# Patient Record
Sex: Male | Born: 1985 | Race: Black or African American | Hispanic: No | Smoking: Current every day smoker
Health system: Southern US, Community
[De-identification: ages and names within clinical notes are randomized; demographics above are authoritative.]

## PROBLEM LIST (undated history)

## (undated) DIAGNOSIS — G4730 Sleep apnea, unspecified: Secondary | ICD-10-CM

## (undated) DIAGNOSIS — D75A Glucose-6-phosphate dehydrogenase (G6PD) deficiency without anemia: Secondary | ICD-10-CM

## (undated) HISTORY — PX: TONSILLECTOMY: SUR1361

---

## 2005-10-04 ENCOUNTER — Ambulatory Visit (HOSPITAL_COMMUNITY): Admission: RE | Admit: 2005-10-04 | Discharge: 2005-10-04 | Payer: Self-pay | Admitting: Family Medicine

## 2005-10-04 ENCOUNTER — Emergency Department (HOSPITAL_COMMUNITY): Admission: EM | Admit: 2005-10-04 | Discharge: 2005-10-04 | Payer: Self-pay | Admitting: Family Medicine

## 2005-12-17 ENCOUNTER — Emergency Department (HOSPITAL_COMMUNITY): Admission: EM | Admit: 2005-12-17 | Discharge: 2005-12-17 | Payer: Self-pay | Admitting: Family Medicine

## 2006-03-14 ENCOUNTER — Emergency Department (HOSPITAL_COMMUNITY): Admission: EM | Admit: 2006-03-14 | Discharge: 2006-03-14 | Payer: Self-pay | Admitting: Emergency Medicine

## 2007-02-17 ENCOUNTER — Emergency Department (HOSPITAL_COMMUNITY): Admission: EM | Admit: 2007-02-17 | Discharge: 2007-02-17 | Payer: Self-pay | Admitting: Emergency Medicine

## 2009-07-29 ENCOUNTER — Emergency Department (HOSPITAL_COMMUNITY): Admission: EM | Admit: 2009-07-29 | Discharge: 2009-07-29 | Payer: Self-pay | Admitting: Emergency Medicine

## 2009-08-17 ENCOUNTER — Emergency Department (HOSPITAL_COMMUNITY): Admission: EM | Admit: 2009-08-17 | Discharge: 2009-08-17 | Payer: Self-pay | Admitting: Family Medicine

## 2011-01-02 ENCOUNTER — Inpatient Hospital Stay (INDEPENDENT_AMBULATORY_CARE_PROVIDER_SITE_OTHER)
Admission: RE | Admit: 2011-01-02 | Discharge: 2011-01-02 | Disposition: A | Discharge status: Home / Self Care | Payer: Self-pay | Source: Ambulatory Visit | Attending: Family Medicine | Admitting: Family Medicine

## 2011-01-02 LAB — POCT I-STAT, CHEM 8
Chloride: 97 mEq/L (ref 96–112)
Creatinine, Ser: 0.7 mg/dL (ref 0.50–1.35)
Glucose, Bld: 379 mg/dL — ABNORMAL HIGH (ref 70–99)
Hemoglobin: 15.6 g/dL (ref 13.0–17.0)

## 2011-01-02 LAB — GLUCOSE, CAPILLARY: Glucose-Capillary: 368 mg/dL — ABNORMAL HIGH (ref 70–99)

## 2011-01-02 LAB — POCT URINALYSIS DIP (DEVICE)
Glucose, UA: 500 mg/dL — AB
Hgb urine dipstick: NEGATIVE
Nitrite: NEGATIVE
Protein, ur: NEGATIVE mg/dL
Specific Gravity, Urine: 1.015 (ref 1.005–1.030)

## 2011-01-12 ENCOUNTER — Inpatient Hospital Stay (HOSPITAL_COMMUNITY)
Admission: EM | Admit: 2011-01-12 | Discharge: 2011-01-14 | DRG: 638 | Disposition: A | Discharge status: Home / Self Care | Payer: Self-pay | Attending: Emergency Medicine | Admitting: Emergency Medicine

## 2011-01-12 DIAGNOSIS — E871 Hypo-osmolality and hyponatremia: Secondary | ICD-10-CM | POA: Diagnosis present

## 2011-01-12 DIAGNOSIS — G4733 Obstructive sleep apnea (adult) (pediatric): Secondary | ICD-10-CM | POA: Diagnosis present

## 2011-01-12 DIAGNOSIS — Z794 Long term (current) use of insulin: Secondary | ICD-10-CM

## 2011-01-12 DIAGNOSIS — E1310 Other specified diabetes mellitus with ketoacidosis without coma: Principal | ICD-10-CM | POA: Diagnosis present

## 2011-01-12 LAB — CBC
HCT: 38.7 % — ABNORMAL LOW (ref 39.0–52.0)
Hemoglobin: 13.6 g/dL (ref 13.0–17.0)
MCH: 30.8 pg (ref 26.0–34.0)
Platelets: 257 10*3/uL (ref 150–400)
RBC: 4.42 MIL/uL (ref 4.22–5.81)
WBC: 5.6 10*3/uL (ref 4.0–10.5)

## 2011-01-12 LAB — COMPREHENSIVE METABOLIC PANEL
ALT: 25 U/L (ref 0–53)
Alkaline Phosphatase: 84 U/L (ref 39–117)
BUN: 11 mg/dL (ref 6–23)
Calcium: 8.9 mg/dL (ref 8.4–10.5)
GFR calc Af Amer: >60 mL/min (ref >60)
Total Bilirubin: 0.3 mg/dL (ref 0.3–1.2)
Total Protein: 6.7 g/dL (ref 6.0–8.3)

## 2011-01-12 LAB — GLUCOSE, CAPILLARY
Glucose-Capillary: 417 mg/dL — ABNORMAL HIGH (ref 70–99)
Glucose-Capillary: 386 mg/dL — ABNORMAL HIGH (ref 70–99)

## 2011-01-12 LAB — URINALYSIS, ROUTINE W REFLEX MICROSCOPIC
Nitrite: NEGATIVE
Urobilinogen, UA: 0.2 mg/dL (ref 0.0–1.0)
pH: 6.5 (ref 5.0–8.0)

## 2011-01-12 LAB — DIFFERENTIAL
Basophils Relative: 1 % (ref 0–1)
Eosinophils Absolute: 0.2 10*3/uL (ref 0.0–0.7)
Eosinophils Relative: 4 % (ref 0–5)
Lymphs Abs: 2.2 10*3/uL (ref 0.7–4.0)
Monocytes Absolute: 0.4 10*3/uL (ref 0.1–1.0)

## 2011-01-13 LAB — BASIC METABOLIC PANEL
BUN: 9 mg/dL (ref 6–23)
Calcium: 8.9 mg/dL (ref 8.4–10.5)
Creatinine, Ser: 0.59 mg/dL (ref 0.50–1.35)
GFR calc non Af Amer: >60 mL/min (ref >60)

## 2011-01-13 LAB — GLUCOSE, CAPILLARY
Glucose-Capillary: 173 mg/dL — ABNORMAL HIGH (ref 70–99)
Glucose-Capillary: 232 mg/dL — ABNORMAL HIGH (ref 70–99)

## 2011-01-13 LAB — HEMOGLOBIN A1C: Mean Plasma Glucose: 355 mg/dL — ABNORMAL HIGH (ref <117)

## 2011-01-14 LAB — GLUCOSE, CAPILLARY

## 2011-01-17 LAB — GLUTAMIC ACID DECARBOXYLASE AUTO ABS: Glutamic Acid Decarb Ab: <1 U/mL (ref <=1.0)

## 2011-01-23 NOTE — Discharge Summary (Signed)
  NAMEAHNAF, CAPONI             ACCOUNT NO.:  [id number]  MEDICAL RECORD NO.:  [id number]  LOCATION:  5035                         FACILITY:  [facility]  PHYSICIAN:  Thalia Turkington, DO         DATE OF BIRTH:  July 28, 1985  DATE OF ADMISSION:  01/12/2011 DATE OF DISCHARGE:  01/14/2011                              DISCHARGE SUMMARY   CHIEF COMPLAINT:  Uncontrolled glucoses.  HISTORY OF PRESENT ILLNESS:  The patient is a 25 year old male who was recently diagnosed with diabetes mellitus.  He was placed on metformin. The patient presented to the emergency department in diabetic ketoacidosis, this patient was admitted.  He was given IV insulin until he close this gap.  Since then he has been on Lantus, his glucose is, however, on high in the last day.  We will increase his Lantus to 35 units.  He will continue his metformin twice daily.  The patient has had diabetic teaching and I have discussed with him in detail the symptoms of hypoglycemia and what actions to take if he has hypoglycemic.  I have discussed with him an appropriate diabetic diet and consequences of chronic right sugars.  These things were also covered with a nutrition consult and the diabetic teaching consult as well.  Today, the patient is doing well.  He is appropriate for discharge.  DISCHARGE INSTRUCTIONS:  ACTIVITY:  As tolerated.  MEDICATIONS: 1. Metformin 500 mg 1 p.o. b.i.d. 2. Lantus 35 units subcu q.p.m.  The patient is to check his fingerstick blood sugars every morning before breakfast and every evening before dinner and any time he feels that his sugars may be low or high he is to take in a record of his blood sugars to his visit to [facility].  The patient is to follow up with [facility] within a month.  DISCHARGE DIAGNOSES:  Diabetic ketoacidosis, obstructive sleep apnea, morbid obesity, hyponatremia which was actually pseudohyponatremia due to the patient's severe hyperglycemia, diabetes mellitus  type 2.  I spent 32 minutes on this discharge.          ______________________________ Fran Lowes, DO    AS/MEDQ  D:  01/14/2011  T:  01/14/2011  Job:  [id number]  cc:   [facility]  Electronically Signed by Fran Lowes DO on 01/23/2011 01:38:38 PM

## 2011-02-09 NOTE — H&P (Signed)
Robert Burton, Robert Burton NO.:  [id number]  MEDICAL RECORD NO.:  [id number]  LOCATION:  1858                         FACILITY:  [facility]  PHYSICIAN:  Zannie Cove, MD     DATE OF BIRTH:  1986/06/18  DATE OF ADMISSION:  01/12/2011 DATE OF DISCHARGE:                             HISTORY & PHYSICAL   PRIMARY CARE PHYSICIAN:  None.  CHIEF COMPLAINT:  Uncontrolled hyperglycemia, blood sugar is greater than 600.  HISTORY OF PRESENT ILLNESS:  Robert Burton is a 25 year old African American gentleman with obesity.  He was recently diagnosed with diabetes about a week ago at urgent care, presents to the hospital due to CBG reading greater than 600 despite taking NovoLog sliding scale that he was recently started on and also blurred vision and numbness in his legs.  The patient reports that since his diagnosis with diabetes little over a week ago, he was started on metformin and NovoLog sliding scale and his mother has been giving him his NovoLog sliding scale with meals.  In addition, he has been taking metformin 1000 mg b.i.d. Despite this, his sugars have been in the mid 400 range for this whole week and he was progressively feeling weak and having blurring of his vision which was worse in the last few days, and hence decided to come to the ER since his readings read very high greater than 600.  Upon evaluation in the ER, his bicarb was found to be normal and his sodium is mildly low at 128, this is likely pseudohyponatremia related to his hyperglycemia, and triad hospitalist were consulted for further management and evaluation.  PAST MEDICAL HISTORY: 1. Type 2 diabetes, newly diagnosed. 2. Suspected obstructive sleep apnea, sleep study pending.  MEDICATIONS: 1. Metformin 1000 mg b.i.d. 2. NovoLog sliding scale insulin.  ALLERGIES:  PENICILLIN and SULFA.  SOCIAL HISTORY:  Single.  Has a girlfriend and a child.  Lives at home with his mother.  Works as a Engineer, site at [employer].  Denies any history of smoking and drinks alcohol only on occasions.  Denies any illicit drug use.  FAMILY HISTORY:  Significant for diabetes in both parents.  REVIEW OF SYSTEMS:  Significant for increased blurring of vision and tingling and numbness in his hands and legs for over the past week.  PHYSICAL EXAMINATION:  VITAL SIGNS:  Temp is 98.4,  pulse is 84, blood pressure 122/75, respirations 16, and sating 97% on room air. GENERAL:  This is heavily fat African American gentleman, lying in the stretcher in no acute distress. HEENT: Pupils round and reactive to light.  Extraocular movements are intact.  Oral mucosa is dry. NECK:  No JVD or lymphadenopathy. CARDIOVASCULAR:  S1 and S2.  Regular rate and rhythm. LUNGS:  With mildly decreased breath sounds to the bases, otherwise clear. ABDOMEN:  Obese, soft, and nontender.  Positive bowel sounds.  No organomegaly. EXTREMITIES:  No edema, clubbing, or cyanosis. NEUROLOGIC:  Moves all extremities.  No localizing signs.  Pinprick and light touch was normal distally in his lower extremities.  LABORATORY DATA:  Review of laboratory data shows a white count of 5.6, hemoglobin 13.6, and platelets of 257.  Chemistries;  sodium 128, potassium 3.9, chloride 92, bicarb 27, BUN 11, creatinine 0.7, and glucose 500.  UA is clear with greater than 1000 glucose, negative proteins.  ASSESSMENT AND PLAN:  This is a 25 year old gentleman with uncontrolled hyperglycemia, newly diagnosed diabetes, and dehydration.  We will admit overnight for 23-hour observation.  We will discontinue his glucometer now that his blood sugars have improved and the glucometer to the 400 range.  I will start him on 20 units of Lantus and sliding scale as well as NovoLog meal coverage.  I will also continue aggressive IV fluid, hydration overnight.  The patient needs diabetic education, insulin teaching, and a primary care physician at the time of  discharge.  The patient will likely be discharged home tomorrow mid day once all of these have been set up.  Further management as condition evolves.     Zannie Cove, MD     PJ/MEDQ  D:  01/12/2011  T:  01/12/2011  Job:  [id number]  Electronically Signed by Zannie Cove  on 02/09/2011 04:18:50 PM

## 2011-09-25 ENCOUNTER — Encounter (HOSPITAL_COMMUNITY): Payer: Self-pay | Admitting: Emergency Medicine

## 2011-09-25 ENCOUNTER — Observation Stay (HOSPITAL_COMMUNITY)
Admission: EM | Admit: 2011-09-25 | Discharge: 2011-09-26 | Disposition: A | Discharge status: Home / Self Care | Payer: Self-pay | Attending: Emergency Medicine | Admitting: Emergency Medicine

## 2011-09-25 DIAGNOSIS — Z794 Long term (current) use of insulin: Secondary | ICD-10-CM | POA: Insufficient documentation

## 2011-09-25 DIAGNOSIS — R739 Hyperglycemia, unspecified: Secondary | ICD-10-CM

## 2011-09-25 DIAGNOSIS — G609 Hereditary and idiopathic neuropathy, unspecified: Secondary | ICD-10-CM | POA: Insufficient documentation

## 2011-09-25 DIAGNOSIS — G4730 Sleep apnea, unspecified: Secondary | ICD-10-CM | POA: Insufficient documentation

## 2011-09-25 DIAGNOSIS — Z91199 Patient's noncompliance with other medical treatment and regimen due to unspecified reason: Secondary | ICD-10-CM | POA: Insufficient documentation

## 2011-09-25 DIAGNOSIS — G629 Polyneuropathy, unspecified: Secondary | ICD-10-CM

## 2011-09-25 DIAGNOSIS — Z9119 Patient's noncompliance with other medical treatment and regimen: Secondary | ICD-10-CM | POA: Insufficient documentation

## 2011-09-25 DIAGNOSIS — E119 Type 2 diabetes mellitus without complications: Principal | ICD-10-CM | POA: Insufficient documentation

## 2011-09-25 HISTORY — DX: Sleep apnea, unspecified: G47.30

## 2011-09-25 LAB — GLUCOSE, CAPILLARY: Glucose-Capillary: 442 mg/dL — ABNORMAL HIGH (ref 70–99)

## 2011-09-25 NOTE — ED Notes (Signed)
Per EMS, pt from home, BS 399, pt reported to EMS that BS has been running high recently; pt also c/o numbness tingling in feet and hands X 1 week;

## 2011-09-25 NOTE — ED Notes (Signed)
Pts CBG 442

## 2011-09-25 NOTE — ED Notes (Signed)
Pt to ED for eval of high BS and numbness, pt reports for past week, bil hands get numb and bil feet get numb; pt reports that walking eases the numbness in feet;  Pt also reports BS have been running high for past week, pt reports he has not taking his insulin, Lantus,  in 4-5 days; and last time pt took metformin was 2 days ago; pt reports nausea; pt c/o nasal congestion and sore throat as well

## 2011-09-26 LAB — POCT I-STAT, CHEM 8
BUN: 12 mg/dL (ref 6–23)
Chloride: 100 mEq/L (ref 96–112)
HCT: 43 % (ref 39.0–52.0)
Potassium: 4.1 mEq/L (ref 3.5–5.1)

## 2011-09-26 LAB — CBC
HCT: 39.5 % (ref 39.0–52.0)
Platelets: 277 10*3/uL (ref 150–400)
RBC: 4.56 MIL/uL (ref 4.22–5.81)
RDW: 11.7 % (ref 11.5–15.5)
WBC: 7.4 10*3/uL (ref 4.0–10.5)

## 2011-09-26 LAB — GLUCOSE, CAPILLARY: Glucose-Capillary: 247 mg/dL — ABNORMAL HIGH (ref 70–99)

## 2011-09-26 MED ORDER — SODIUM CHLORIDE 0.9 % IV BOLUS (SEPSIS)
1000.0000 mL | Freq: Once | INTRAVENOUS | Status: AC
  Administered 2011-09-26: 1000 mL via INTRAVENOUS

## 2011-09-26 MED ORDER — HYDROCODONE-ACETAMINOPHEN 5-325 MG PO TABS: 1.0000 | ORAL_TABLET | Freq: Four times a day (QID) | ORAL | Status: AC | PRN

## 2011-09-26 MED ORDER — METFORMIN HCL 500 MG PO TABS: 500.0000 mg | ORAL_TABLET | Freq: Two times a day (BID) | ORAL | Status: DC

## 2011-09-26 MED ORDER — INSULIN GLARGINE 100 UNIT/ML ~~LOC~~ SOLN: 35.0000 [IU] | Freq: Every morning | SUBCUTANEOUS | Status: DC

## 2011-09-26 MED ORDER — INSULIN GLARGINE 100 UNIT/ML ~~LOC~~ SOLN
35.0000 [IU] | Freq: Every morning | SUBCUTANEOUS | Status: DC
  Administered 2011-09-26: 35 [IU] via SUBCUTANEOUS
  Filled 2011-09-26: qty 1

## 2011-09-26 MED ORDER — INSULIN ASPART 100 UNIT/ML ~~LOC~~ SOLN
10.0000 [IU] | Freq: Once | SUBCUTANEOUS | Status: AC
  Administered 2011-09-26: 10 [IU] via SUBCUTANEOUS
  Filled 2011-09-26: qty 1

## 2011-09-26 MED ORDER — ONDANSETRON HCL 4 MG/2ML IJ SOLN
4.0000 mg | Freq: Once | INTRAMUSCULAR | Status: AC
  Administered 2011-09-26: 4 mg via INTRAVENOUS
  Filled 2011-09-26: qty 2

## 2011-09-26 MED ORDER — MORPHINE SULFATE 4 MG/ML IJ SOLN
4.0000 mg | Freq: Once | INTRAMUSCULAR | Status: AC
  Administered 2011-09-26: 4 mg via INTRAVENOUS
  Filled 2011-09-26: qty 1

## 2011-09-26 MED ORDER — METFORMIN HCL 500 MG PO TABS
500.0000 mg | ORAL_TABLET | Freq: Two times a day (BID) | ORAL | Status: DC
  Administered 2011-09-26: 500 mg via ORAL
  Filled 2011-09-26 (×2): qty 1

## 2011-09-26 MED ORDER — SODIUM CHLORIDE 0.9 % IV SOLN
INTRAVENOUS | Status: DC
  Administered 2011-09-26: 03:00:00 via INTRAVENOUS

## 2011-09-26 MED ORDER — HYDROCODONE-ACETAMINOPHEN 5-325 MG PO TABS
2.0000 | ORAL_TABLET | Freq: Once | ORAL | Status: AC
  Administered 2011-09-26: 2 via ORAL
  Filled 2011-09-26: qty 2

## 2011-09-26 NOTE — ED Notes (Signed)
Case mgmt present to speak with pt

## 2011-09-26 NOTE — Progress Notes (Signed)
Met with patient and friend. Patient states he was turned away from [facility] back in July for not having appropriate documentation. I explained the role of the community liaison and the resources [facility] can provide. Patient was accepting of the referral. I also provided him with the physician resource list and explained Jovita Kussmaul. I provided patient with a community discount prescription card as well. Discussions in the room were frequently interjected upon by the friend and then I was handed the phone to speak with the patient's mother. After asking for permission to speak with the mother, I introduced myself to her. Her voice was extremely loud so I had to hold the phone away from my ear to continue the conversation. Mother seemed concerned about patient being discharged without receiving the appropriate therapy. Again, I introduced myself and explained my role and the resources I was providing the patient. I also let her know that his blood sugar was in a therapeutic range and his discharge was also being coordinated with outpatient resources. Mother increased her distress and stated that she did not like my attitude. I tried to subdue the situation and explain how we were helping her son. Mother then told me she would be arriving in 10 minutes "and take care of things". I informed both the attending nurse and the PA of my interactions. I made the referral to the community liaison and approved the prescription for lantus and tubed it to pharmacy.

## 2011-09-26 NOTE — ED Provider Notes (Signed)
History     CSN: [id number]  Arrival date & time 09/25/11  2308   First MD Initiated Contact with Patient 09/26/11 0004      Chief Complaint  Patient presents with  . Hyperglycemia    (Consider location/radiation/quality/duration/timing/severity/associated sxs/prior treatment) The history is provided by the patient.   Patient reports that he was diagnosed as being diabetic a few months ago. He was evaluated in emergency room and sent home with prescriptions for insulin and metformin. He has since ran out of those medications and states he has been taking his brothers insulin until a few days ago. Presents today complaining of bilateral feet and hand numbness. He has had urinary frequency and feels thirsty and dehydrated. [facility] initially was evaluated at [facility] and since told that he cannot followup there.  He has not attempted to find any no other primary care physician. No fevers or chills. Some nausea but no vomiting or diarrhea. He has been congested recently but denies any significant cold symptoms. He is mostly concerned about his medications and extremity tingling.  No rash. No trauma. No neck or back pain. Mild/moderate in severity. No known aggravating or alleviating factors. No history of same. Patient states he is unable to fill any prescriptions Past Medical History  Diagnosis Date  . Diabetes mellitus   . Sleep apnea     Past Surgical History  Procedure Date  . Tonsillectomy     History reviewed. No pertinent family history.  History  Substance Use Topics  . Smoking status: Never Smoker   . Smokeless tobacco: Not on file  . Alcohol Use: Yes     occasionally      Review of Systems  Constitutional: Negative for fever and chills.  HENT: Negative for neck pain and neck stiffness.   Eyes: Negative for pain.  Respiratory: Negative for shortness of breath.   Cardiovascular: Negative for chest pain.  Gastrointestinal: Negative for abdominal pain.  Genitourinary:  Negative for dysuria.  Musculoskeletal: Negative for back pain.  Skin: Negative for rash.  Neurological: Negative for dizziness, seizures, syncope, speech difficulty, weakness and headaches.  All other systems reviewed and are negative.    Allergies  Review of patient's allergies indicates no known allergies.  Home Medications   Current Outpatient Rx  Name Route Sig Dispense Refill  . INSULIN GLARGINE 100 UNIT/ML Lued[place] SOLN Subcutaneous Inject 35 Units into the skin every morning.    Marland Kitchen METFORMIN HCL 500 MG PO TABS Oral Take 500 mg by mouth 2 (two) times daily with a meal.      BP 143/76  Pulse 79  Temp(Src) 97.9 F (36.6 C) (Oral)  Resp 18  SpO2 97%  Physical Exam  Constitutional: He is oriented to person, place, and time. He appears well-developed and well-nourished.  HENT:  Head: Normocephalic and atraumatic.  Eyes: Conjunctivae and EOM are normal. Pupils are equal, round, and reactive to light.  Neck: Trachea normal. Neck supple. No thyromegaly present.  Cardiovascular: Normal rate, regular rhythm, S1 normal, S2 normal and normal pulses.     No systolic murmur is present   No diastolic murmur is present  Pulses:      Radial pulses are 2+ on the right side, and 2+ on the left side.  Pulmonary/Chest: Effort normal and breath sounds normal. He has no wheezes. He has no rhonchi. He has no rales. He exhibits no tenderness.  Abdominal: Soft. Normal appearance and bowel sounds are normal. There is no tenderness. There is  no CVA tenderness and negative Murphy's sign.  Musculoskeletal:       BLE:s Calves nontender, no cords or erythema, negative Homans sign  Neurological: He is alert and oriented to person, place, and time. He has normal strength. No cranial nerve deficit or sensory deficit. GCS eye subscore is 4. GCS verbal subscore is 5. GCS motor subscore is 6.       Subjective paresthesias to hands and feet with sensorium to light touch intact and equal throughout. No unilateral  deficits.  Skin: Skin is warm and dry. No rash noted. He is not diaphoretic.  Psychiatric: His speech is normal.       Cooperative and appropriate    ED Course  Procedures (including critical care time)  Labs Reviewed  GLUCOSE, CAPILLARY - Abnormal; Notable for the following:    Glucose-Capillary 442 (*)    All other components within normal limits  POCT I-STAT, CHEM 8 - Abnormal; Notable for the following:    Glucose, Bld 444 (*)    All other components within normal limits  GLUCOSE, CAPILLARY - Abnormal; Notable for the following:    Glucose-Capillary 247 (*)    All other components within normal limits  CBC   IV fluids. Subcutaneous insulin. Serial evaluations. Patient placed on hyperglycemia observation protocol.    MDM   Hyperglycemia with noncompliance and out of medications. Plan recheck in a.m. and social work consult for help with medications. Continue IV fluids and insulin as needed for blood sugars.         Sunnie Nielsen, MD 09/26/11 [phone]

## 2011-09-26 NOTE — ED Provider Notes (Signed)
8:26 AM Patient is in CDU for control of hyperglycemia, and consultation with case management for primary care follow up and medication refills.  Sign out received from Dr Dierdre Highman. Plan is for continued monitoring and case management consultation for free medications and help with PCP follow up, then discharge home.  Patient reports continued pain and numbness in bilateral hands and feet.  Discussed peripheral neuropathy as a result of uncontrolled diabetes, encouraged close control of blood sugars.  Offered literature which patient declined. On exam, pt is A&Ox4, NAD, RRR, CTAB.  Last cbg 234.  Will continue to follow.    8:58 AM I have spoken with Dewayne Hatch, Sports coach, who will speak with patient and Sunny Schlein, community liaison through [employer] for [employer].    9:18 AM Patient and significant other concerned about patient's continued numbness in his hands and feet.  Pt also notes that he is involved in court proceedings and has quit his job because it was too painful to stand on his feet all day - states the judge needs a letter from a doctor stating patient needs to be out of work for extended period of time.  I explained that this would be better fulfilled by a primary care provider.  Girlfriend also states she wants [place] to keep him and monitor him for his blurry vision and peripheral neuropathy - I have explained that we will be giving him the medications he needs to control his blood sugar and will be setting him up with doctors, which is what he needs as diabetes will be a life long issue and control of his blood sugar is ultimately what he needs.  I also encouraged him to have regular eye examinations and discussed the risks associated with uncontrolled diabetes.  Patient verbalizes understanding and agrees with plan.    10:35 AM Discussed patient and current situation with Dr Preston Fleeting who is aware of patient and agrees with plan.    10:41 AM Patient has been seen by Dewayne Hatch and Sunny Schlein.  They were able to  arrange for [facility] appointment for him today but patient does not have a photo ID.  They have given him phone numbers for follow up with them to schedule an appointment once he receives his ID.  (Pt states the ID should be in the mail, it is being replaced from his losing his wallet)  Apparently patient had previous appointment with [facility] after his hospitalization last summer but was turned away for not having an ID.  We are able to provide him with a vial of Lantus for free from the pharmacy.  And I have explained to him that (per Dewayne Hatch) he can pay $3.99 per year to get his metformin for free from [employer].    10:58 AM Discussed all results and plan with patient.  Patient verbalizes understanding and agrees with plan.  All questions answered.  Plan is for discharge.    Dillard Cannon [place], [place] 09/26/11 1552  Sunnie Nielsen, MD 09/28/11 [phone]

## 2011-09-26 NOTE — ED Notes (Signed)
Family member upset with current plan of care. States pt's pain not under control and that pt will not be able to fill prescription at home. Pt remains somnolent during this discussion. Family member also asked if pt can walk to cafeteria. PA aware.

## 2011-09-26 NOTE — ED Notes (Addendum)
Patient states his feet and hands feel numb and he feels nauseated. Patient states he was diagnosed with DM at end of 2012. Patient ran out of his medication and has not seen a doctor since he was hospitalized and diagnosed with DM. Patient states he has pain in his feet, bilateral at this time.

## 2011-09-26 NOTE — ED Notes (Signed)
Pt to be set up with [facility] at 1000 and also will receive lantus from [facility].

## 2011-09-26 NOTE — ED Notes (Signed)
Patient denies pain and is resting comfortably. States bilateral numbness in both hands.

## 2011-09-26 NOTE — Discharge Instructions (Signed)
Please read the information below.  Once your get your photo ID in the mail, please call Felicia back so she can help you set up an appointment to get your orange card and get an appointment with [facility].  You may also use the resources below to find a primary care provider.  Please use the Lantus as prescribed.  Follow a diabetic diet closely- this means you must decrease all sugars and starches from your meals (this includes not only sweets but white bread, white rice, potatoes, etc.)  Refer to the packet of information given to you upon discharge from the hospital this summer.  Remember that you should be able to get your metformin prescription filled at [facility] for approximately $4 for the entire year. Please take this medication twice daily as prescribed.  You may return to the ER at any time for worsening condition or any new symptoms that concern you.   Type 2 Diabetes Diabetes is a long-lasting (chronic) disease. One or both of the following happen with type 2 diabetes:   The pancreas does not make enough of a hormone called insulin.   The body has trouble using the insulin that is made.  HOME CARE  Check your blood sugar (glucose) once a day, or as told by your doctor.   Take all medicine as told by your doctor.   Do not smoke.   Eat healthy foods. Weight loss can help your diabetes.   Learn about low blood sugar (hypoglycemia). Know how to treat it.   Get your eyes checked on a regular basis.   Get a physical exam every year. Get your blood pressure checked. Get your blood and pee (urine) tested.   Wear a necklace or bracelet that says you have diabetes.   Check your feet every night for cuts, sores, blisters, and redness. Tell your doctor if you have problems.  GET HELP RIGHT AWAY IF:  You have trouble keeping your blood sugar in target range.   You have problems with your medicines.   You are sick and not getting better after 24 hours.   You have a sore or  wound that is not healing.   You have vision problems or changes.   You have a fever.  MAKE SURE YOU:  Understand these instructions.   Will watch your condition.   Will get help right away if you are not doing well or get worse.  Document Released: 04/04/2008 Document Revised: 06/15/2011 Document Reviewed: 12/12/2010 [facility] Patient Information 2012 [place], [place].Hyperglycemia Hyperglycemia occurs when the glucose (sugar) in your blood is too high. Hyperglycemia can happen for many reasons, but it most often happens to people who do not know they have diabetes or are not managing their diabetes properly.  Whether you have diabetes or not, there are other causes of hyperglycemia. Hyperglycemia can occur when you have diabetes, but it can also occur in other situations that you might not be as aware of, such as:  Diabetes.   Pre-diabetes. Before people develop Type 2 diabetes, they almost always have "pre-diabetes." This is when your blood glucose levels are higher than normal, but not yet high enough to be diagnosed as diabetes.   Stress. Stress can elevate your blood glucose.   Taking steroids. One side effect can be a rise in blood glucose.  SYMPTOMS  Signs of hyperglycemia include having extreme thirst, dry mouth, blurred vision, needing to urinate often, being overly tired, weak or sleepy, or having tingling in  the feet or leg. DIAGNOSIS  Diagnosis is made by monitoring blood glucose. TREATMENT  Knowing the cause of the hyperglycemia is important before the hyperglycemia can be treated. Treatment may include, but is not be limited to:  Education.   Change or adjustment in medications, your meal plan, exercise plan or lifestyle.   Treatment for an illness, infection, etc.   More frequent blood glucose monitoring.   Decreasing or stopping steroids.  HOME CARE INSTRUCTIONS   Test your blood glucose as directed.   Exercise regularly.   Eat wholesome, balanced meals.  Eat often and at regular, fixed times.   Being at an ideal weight is important. If needed, losing as little as 10 to 15 pounds may help improve blood glucose levels.  SEEK MEDICAL CARE IF:   You have questions about medicine, activity or diet.   You continue to have symptoms such as increased thirst, urination or weight gain.  SEEK IMMEDIATE MEDICAL CARE IF:   You are vomiting or have diarrhea.   Your breath smells fruity.   You are breathing faster or slower.   You are very sleepy or incoherent.   You have numbness, tingling, or pain in your feet or hands.   You have chest pain.   Your symptoms get worse even though you have been following your caregiver's orders.  Document Released: 06/26/2005 Document Revised: 06/15/2011 Document Reviewed: 02/16/2009 [facility] Patient Information 2012 [place], [place].Insulin Storage and Care All insulin pens and bottles (vials) have expiration dates. Refrigerated, unopened insulin pens and vials are good until the expiration date. Do not use insulin after this date. Once insulin is opened, it should be thrown away at the appropriate time. The table below will help guide you. INSULINS  Humalog (lispro)   Vials: 28 days   Pens: 28 days   NovoLog(aspart)   Vials: 28 days   Pens: 28 days   Apidra( glulisine)   Vials: 28 days*   Pens: 28 days*   Humulin R (regular)   Vials: 28 days   Pens: 28 days   Humulin R (regular) U-500   Vials: 28 days   Pens: NA   Novolin R (regular)   Vials: 42 days*   Pens: 28 days   Humulin N (NPH)   Vials: 28 days   Pens: 14 days   Novolin N (NPH)   Vials: 42 days*   Pens: 14 days   Humulin 70/30   Vials: 28 days   Pens: 10 days   Humulin 50/50   Vials: 28 days   Pens: 10 days   Novolin 70/30   Vials: 42 days*   Pens: 10 days   Humalog Mix 75/25   Vials: 28 days   Pens: 10 days   Humalog 50/50   Vials: 28 days   Pens: 10 days   NovoLog Mix  70/30   Vials: 28 days   Pens: 14 days   Lantus (glargine)   Vials: 28 days   Pens: 28 days   Levemir (detemir)   Vials: 42 days*   Pens: 42 days*  CARING FOR YOUR INSULIN  If insulin is kept at room temperature, it must be less than 86 F (30 C). Those marked * must be stored at less than 77 F (25 C).   If insulin is kept in the refrigerator, it must be between 36-46 F (2.22-7.77 C).   Do not freeze insulin.   Keep insulin away from direct heat or sunlight.   Throw away  the insulin if it is discolored, thick or has clumps, or suspended white particles.   Be sure to mix "cloudy" insulins by rolling gently. Pens can be "rocked" from end to end.   Opened insulin pens should be kept at room temperature.   Always have extra insulin on hand.  Every effort is made to assure accuracy. This information is meant to be used as a tool to guide you in the proper care and storage of insulin. Document Released: 04/23/2009 Document Revised: 06/15/2011 Document Reviewed: 04/23/2009 [facility] Patient Information 2012 [place], [place].Neuropathy Neuropathy means your peripheral nerves are not working normally. Peripheral nerves are the nerves outside the brain and spinal cord. Messages between the brain and the rest of the body do not work properly with peripheral nerve disorders. CAUSES There are many different causes of peripheral nerve disorders. These include:  Injury.   Infections.   Diabetes.   Vitamin deficiency.   Poor circulation.   Alcoholism.   Exposure to toxins.   Drug effects.   Tumors.   Kidney disease.  SYMPTOMS  Tingling, burning, pain, and numbness in the extremities.   Weakness and loss of muscle tone and size.  DIAGNOSIS Blood tests and special studies of nerve function may help confirm the diagnosis.  TREATMENT  Treatment includes adopting healthy life habits.   A good diet, vitamin supplements, and mild pain medicine may be needed.    Avoid known toxins such as alcohol, tobacco, and recreational drugs.   Anti-convulsant medicines are helpful in some types of neuropathy.  Make a follow-up appointment with your caregiver to be sure you are getting better with treatment.  SEEK IMMEDIATE MEDICAL CARE IF:   You have breathing problems.   You have severe or uncontrolled pain.   You notice extreme weakness or you feel faint.   You are not better after 1 week or if you have worse symptoms.  Document Released: 08/03/2004 Document Revised: 03/08/2011 Document Reviewed: 06/26/2005 [facility] Patient Information 2012 [place], [place].  If you have no primary doctor, here are some resources that may be helpful:  Medicaid-accepting [facility] Providers:   - [facility]- [address]      [phone]      Mon-Fri 9am-7pm, Sat 9am-1pm   - [facility]- [address] [place]      [phone]   - [facility]- [address], [address] [place]      [phone]   [facility]- [address]      [phone]   - Renaye Rakers- [address] [place], [address]      [phone]      Only accepts [place] patients       after they have her name applied to their card   Self Pay (no insurance) in [place]:   - Sickle Cell Patients: Dr Willey Blade, [facility]      [address] [place]      [phone]   - [phone]   - Physician Referral Service- [phone]   - [facility]- [address] [place]      [phone]   [facility] [place]- [address]   - [facility]- see information above      (Speak to [employer] if you do not have insurance)   - [phone]  [address]      [phone]   - [facility]- [address]      [phone]   - [facility]- [address]      [phone]   - Dr Julio Sicks-  [address], [place]      [phone]   - [facility]- [address]      [phone]   - [facility]- [address]      [phone]      Also [address]      [phone]   - [facility]- [address]      [phone]      1st and 3rd Saturday every month, 10am-1pm Other agencies that provide inexpensive medical care:    [facility]  [phone]    [facility]  [phone]    [facility]  [phone]    [facility]  [phone]    [facility]  [phone]  General Information: Finding a doctor when you do not have health insurance can be tricky. Although you are not limited by an insurance plan, you are of course limited by her finances and how much but he can pay out of pocket.  What are your options if you don't have health insurance?   1) Find a Librarian, academic and Pay Out of Pocket Although you won't have to find out who is covered by your insurance plan, it is a good idea to ask around and get recommendations. You will then need to call the office and see if the doctor you have chosen will accept you as a new patient and what types of options they offer for patients who are self-pay. Some doctors offer discounts or will set up payment plans for their patients who do not have insurance, but you will need to ask so you aren't surprised when you get to your appointment.  2) Contact Your Local Health Department Not all health departments have doctors that can see patients for sick visits, but many do, so it is worth a call to see if yours does. If you don't know where your local health department is, you can check in your phone book. The CDC also has a tool to help you locate your state's health department, and many state websites also have listings of all of their local health departments.  3) Find a Walk-in Clinic If your illness is not likely to be very severe or complicated, you may want to try a walk  in clinic. These are popping up all over the country in pharmacies, drugstores, and shopping centers. They're usually staffed by nurse practitioners or physician assistants that have been trained to treat common illnesses and complaints. They're usually fairly quick and inexpensive. However, if you have serious medical issues or chronic medical problems, these are probably not your best option   RESOURCE GUIDE  Dental Problems  Patients with Medicaid: [facility] [phone]                                           [phone] [employer] [address] Phone:  [phone]  Phone:  [phone]  If unable to pay or uninsured, contact:  [facility] or [facility]. to become qualified for the adult dental clinic.  Chronic Pain Problems Contact [facility]  [phone] Patients need to be referred by their primary care doctor.  Insufficient Money for Medicine Contact [employer]:  call "211" or [facility] [phone].  No Primary Care Doctor Call [facility]  [phone] Other agencies that provide inexpensive medical care    [facility]  [phone]    [facility]  [phone]    [facility]  [phone]    [facility]  [phone]    [facility]  [phone]    [facility]  [phone]  [facility]  [phone] [facility]  [phone] [facility]   [phone] (emergency services [phone])  Substance Abuse Resources [facility]  [phone] [facility] [phone] [place] [phone] Floydene Flock [phone] Residential & Outpatient Substance Abuse Program  [phone]  Abuse/Neglect [facility] Child Abuse Hotline 567-706-1980 [facility] Child Abuse Hotline (380)260-8463 (After Hours)  Emergency  [facility] [phone]  [facility] [place] [facility] [phone] [facility] [phone]  MRSA Hotline #:   347-853-1622    [facility] Resources  [facility]     [employer]                          [facility] Dept. [address] [place]                       [address]      [address]  Blondell Reveal Phone:  [phone]                                   Phone:  [phone]                 Phone:  [phone]  [facility] Phone:  [phone]  [facility] Child Abuse Hotline 613-313-8727 803-324-1361 (After Hours)

## 2011-09-26 NOTE — ED Notes (Addendum)
Pt d/c hospital in [place]. PT given Lantus and d/c instructions on medication administration and how to get medications filled. Pt voiced understanding of d/c instructions and that he would follow up here once he has photo id to get orange card and [facility] appt.

## 2011-09-26 NOTE — ED Notes (Signed)
Report given to Vickey Huger, Charity fundraiser. Patient being transported to CDU4.

## 2011-09-26 NOTE — ED Notes (Signed)
Diet tray ordered 

## 2011-09-26 NOTE — Progress Notes (Signed)
Received vial of lantus and transferred to the attending nurse, Alycia Rossetti. Felicia of [facility] met with patient and discovered she cannot be of any assistance because the patient does not have any form of identification and is not sure when he will receive any. All of this information was passed along to the PA.

## 2012-02-08 ENCOUNTER — Emergency Department (HOSPITAL_COMMUNITY)
Admission: EM | Admit: 2012-02-08 | Discharge: 2012-02-08 | Discharge status: Absent without leave | Payer: Self-pay | Source: Home / Self Care | Attending: Emergency Medicine | Admitting: Emergency Medicine

## 2013-01-28 ENCOUNTER — Encounter (HOSPITAL_COMMUNITY): Payer: Self-pay

## 2013-01-28 ENCOUNTER — Emergency Department (HOSPITAL_COMMUNITY)
Admission: EM | Admit: 2013-01-28 | Discharge: 2013-01-28 | Disposition: A | Discharge status: Home / Self Care | Payer: Self-pay | Attending: Emergency Medicine | Admitting: Emergency Medicine

## 2013-01-28 DIAGNOSIS — Z23 Encounter for immunization: Secondary | ICD-10-CM | POA: Insufficient documentation

## 2013-01-28 DIAGNOSIS — E119 Type 2 diabetes mellitus without complications: Secondary | ICD-10-CM | POA: Insufficient documentation

## 2013-01-28 DIAGNOSIS — Z794 Long term (current) use of insulin: Secondary | ICD-10-CM | POA: Insufficient documentation

## 2013-01-28 DIAGNOSIS — S51842A Puncture wound with foreign body of left forearm, initial encounter: Secondary | ICD-10-CM

## 2013-01-28 DIAGNOSIS — Y929 Unspecified place or not applicable: Secondary | ICD-10-CM | POA: Insufficient documentation

## 2013-01-28 DIAGNOSIS — W268XXA Contact with other sharp object(s), not elsewhere classified, initial encounter: Secondary | ICD-10-CM | POA: Insufficient documentation

## 2013-01-28 DIAGNOSIS — Y9389 Activity, other specified: Secondary | ICD-10-CM | POA: Insufficient documentation

## 2013-01-28 DIAGNOSIS — Z79899 Other long term (current) drug therapy: Secondary | ICD-10-CM | POA: Insufficient documentation

## 2013-01-28 DIAGNOSIS — S51809A Unspecified open wound of unspecified forearm, initial encounter: Secondary | ICD-10-CM | POA: Insufficient documentation

## 2013-01-28 MED ORDER — BACITRACIN 500 UNIT/GM EX OINT
1.0000 "application " | TOPICAL_OINTMENT | Freq: Two times a day (BID) | CUTANEOUS | Status: DC
  Administered 2013-01-28: 1 via TOPICAL
  Filled 2013-01-28: qty 28.4

## 2013-01-28 MED ORDER — TETANUS-DIPHTH-ACELL PERTUSSIS 5-2.5-18.5 LF-MCG/0.5 IM SUSP
0.5000 mL | Freq: Once | INTRAMUSCULAR | Status: AC
  Administered 2013-01-28: 0.5 mL via INTRAMUSCULAR
  Filled 2013-01-28 (×2): qty 0.5

## 2013-01-28 NOTE — ED Provider Notes (Signed)
History  This chart was scribed for Glade Nurse, PA-C working with Enid Skeens, MD by Greggory Stallion, ED scribe. This patient was seen in room TR08C/TR08C and the patient's care was started at 8:41 PM.  CSN: [id number] Arrival date & time 01/28/13  2011  Chief Complaint  Patient presents with  . Foreign Body   The history is provided by the patient. No language interpreter was used.    HPI Comments: Robert Burton is a 27 y.o. male who presents to the Emergency Department complaining of a fish hook under his left elbow that happened about an hour ago as he was trying to throw the line and it got caught in his skin. Pt states pain is minimal, intermittent, localized. No active bleeding. Pt states his last tetanus was 7 years ago. Pt denies fever, nausea, vomiting.   Past Medical History  Diagnosis Date  . Diabetes mellitus   . Sleep apnea    Past Surgical History  Procedure Laterality Date  . Tonsillectomy     History reviewed. No pertinent family history. History  Substance Use Topics  . Smoking status: Never Smoker   . Smokeless tobacco: Not on file  . Alcohol Use: Yes     Comment: occasionally    Review of Systems  Constitutional: Negative for fever and diaphoresis.  HENT: Negative for neck pain and neck stiffness.   Eyes: Negative for visual disturbance.  Respiratory: Negative for apnea, chest tightness and shortness of breath.   Cardiovascular: Negative for chest pain and palpitations.  Gastrointestinal: Negative for diarrhea and constipation.  Genitourinary: Negative for dysuria.  Musculoskeletal: Negative for gait problem.  Skin: Positive for wound. Negative for rash.       Left anterior forearm distal to the elbow  Neurological: Negative for dizziness, weakness, light-headedness and headaches.    Allergies  Other  Home Medications   Current Outpatient Rx  Name  Route  Sig  Dispense  Refill  . insulin glargine (LANTUS) 100 UNIT/ML injection    Subcutaneous   Inject 35 Units into the skin every morning.         . metFORMIN (GLUCOPHAGE) 1000 MG tablet   Oral   Take 1,000 mg by mouth 2 (two) times daily with a meal.         . EXPIRED: metFORMIN (GLUCOPHAGE) 500 MG tablet   Oral   Take 1 tablet (500 mg total) by mouth 2 (two) times daily with a meal.   60 tablet   1    BP 150/97  Pulse 79  Temp(Src) 98.5 F (36.9 C) (Oral)  Resp 16  SpO2 97%  Physical Exam  Nursing note and vitals reviewed. Constitutional: He is oriented to person, place, and time. He appears well-developed and well-nourished. No distress.  HENT:  Head: Normocephalic and atraumatic.  Eyes: Conjunctivae and EOM are normal.  Neck: Normal range of motion. Neck supple.  No meningeal signs  Cardiovascular: Normal rate, regular rhythm and normal heart sounds.  Exam reveals no gallop and no friction rub.   No murmur heard. Pulmonary/Chest: Effort normal and breath sounds normal. No respiratory distress. He has no wheezes. He has no rales. He exhibits no tenderness.  Abdominal: Soft. Bowel sounds are normal. He exhibits no distension. There is no tenderness. There is no rebound and no guarding.  Musculoskeletal: Normal range of motion. He exhibits no edema and no tenderness.  Neurological: He is alert and oriented to person, place, and time. No cranial nerve deficit.  Skin: Skin is warm and dry. He is not diaphoretic. No erythema.  Fish hook penetrating the left anterior forearm distal to the elbow. No active bleeding. Barbed side is immersed in the skin.   Psychiatric: He has a normal mood and affect.    ED Course  Procedures  DIAGNOSTIC STUDIES: Oxygen Saturation is 97% on RA, normal by my interpretation.    COORDINATION OF CARE: 8:45 PM-Discussed treatment plan which includes removing fish hook with pt at bedside and pt agreed to plan.   Labs Reviewed - No data to display No results found. 1. Penetrating foreign body of skin of left forearm      MDM  Pt case discussed with and seen by Dr. Jodi Mourning who discussed risk/benefits and obtained consent from the pt. A time out was called and injury site cleaned and numbed with 2% lidocaine with epi by me. Dr. Jodi Mourning attempted to guide the hook out with an 18 gauge needle and then pushed the barbed portion of the hook through the skin. Nursing trimmed off the barb and pulled the hook out clean. No active bleeding. Will apply bacitracin and sterile dressing. Discussed reasons to seek immediate care. Patient expresses understanding and agrees with plan.   I personally performed the services described in this documentation, which was scribed in my presence. The recorded information has been reviewed and is accurate.    Glade Nurse, PA-C 01/28/13 2350

## 2013-01-28 NOTE — ED Notes (Addendum)
Pt presents to ED with a fish hook in his Left forearm. Pt attempted to remove fish hook on his own. Pt reports last tetanus shot was maybe 7 years ago

## 2013-01-30 NOTE — ED Provider Notes (Signed)
Medical screening examination/treatment/procedure(s) were conducted as a shared visit with non-physician practitioner(s) or resident  and myself.  I personally evaluated the patient during the encounter and agree with the findings and plan unless otherwise indicated.  Foreign body removal.   Lidocaine used after betadine.  Hook pushed through, barb cut and hook removed.  Fup discussed.  Enid Skeens, MD 01/30/13 [phone]

## 2013-04-29 ENCOUNTER — Encounter (HOSPITAL_COMMUNITY): Payer: Self-pay | Admitting: Emergency Medicine

## 2013-04-29 ENCOUNTER — Emergency Department (HOSPITAL_COMMUNITY)
Admission: EM | Admit: 2013-04-29 | Discharge: 2013-04-29 | Disposition: A | Discharge status: Home / Self Care | Payer: Self-pay | Attending: Emergency Medicine | Admitting: Emergency Medicine

## 2013-04-29 DIAGNOSIS — R110 Nausea: Secondary | ICD-10-CM | POA: Insufficient documentation

## 2013-04-29 DIAGNOSIS — Z79899 Other long term (current) drug therapy: Secondary | ICD-10-CM | POA: Insufficient documentation

## 2013-04-29 DIAGNOSIS — Z794 Long term (current) use of insulin: Secondary | ICD-10-CM | POA: Insufficient documentation

## 2013-04-29 DIAGNOSIS — M549 Dorsalgia, unspecified: Secondary | ICD-10-CM | POA: Insufficient documentation

## 2013-04-29 DIAGNOSIS — R51 Headache: Secondary | ICD-10-CM | POA: Insufficient documentation

## 2013-04-29 DIAGNOSIS — R739 Hyperglycemia, unspecified: Secondary | ICD-10-CM

## 2013-04-29 DIAGNOSIS — E119 Type 2 diabetes mellitus without complications: Secondary | ICD-10-CM | POA: Insufficient documentation

## 2013-04-29 LAB — GLUCOSE, CAPILLARY
Glucose-Capillary: 532 mg/dL — ABNORMAL HIGH (ref 70–99)
Glucose-Capillary: 334 mg/dL — ABNORMAL HIGH (ref 70–99)
Glucose-Capillary: 231 mg/dL — ABNORMAL HIGH (ref 70–99)

## 2013-04-29 LAB — URINALYSIS, ROUTINE W REFLEX MICROSCOPIC
Glucose, UA: >1000 mg/dL — AB
Hgb urine dipstick: NEGATIVE
Specific Gravity, Urine: <1.005 — ABNORMAL LOW (ref 1.005–1.030)
pH: 5.5 (ref 5.0–8.0)

## 2013-04-29 LAB — CBC
MCH: 31.6 pg (ref 26.0–34.0)
MCV: 91 fL (ref 78.0–100.0)
Platelets: 254 10*3/uL (ref 150–400)
RDW: 11.8 % (ref 11.5–15.5)
WBC: 7.3 10*3/uL (ref 4.0–10.5)

## 2013-04-29 LAB — COMPREHENSIVE METABOLIC PANEL
AST: 31 U/L (ref 0–37)
Albumin: 3.5 g/dL (ref 3.5–5.2)
Calcium: 8.9 mg/dL (ref 8.4–10.5)
Chloride: 96 mEq/L (ref 96–112)
Creatinine, Ser: 0.97 mg/dL (ref 0.50–1.35)
Total Protein: 7 g/dL (ref 6.0–8.3)

## 2013-04-29 LAB — URINE MICROSCOPIC-ADD ON

## 2013-04-29 MED ORDER — INSULIN ASPART 100 UNIT/ML ~~LOC~~ SOLN
10.0000 [IU] | Freq: Once | SUBCUTANEOUS | Status: AC
  Administered 2013-04-29: 10 [IU] via INTRAVENOUS
  Filled 2013-04-29: qty 1

## 2013-04-29 MED ORDER — SODIUM CHLORIDE 0.9 % IV BOLUS (SEPSIS)
1000.0000 mL | Freq: Once | INTRAVENOUS | Status: AC
  Administered 2013-04-29: 1000 mL via INTRAVENOUS

## 2013-04-29 NOTE — ED Notes (Signed)
Pt reports that for the past 2 days that he has had a headache and right sided abd pain. States that he takes insulin and pills for the diabetes but the medication is not working. Denies any vomiting, but reports increased urination.

## 2013-04-29 NOTE — ED Notes (Signed)
Per pt, since yesterday, bad headache. "Cant open eyes". " right hand feels numb". For 4-5 days, pain located in right abdomen radiating towards the back. Pain comes and goes. Denies CP, SOB, N/V/D. States he feels really tired all the time.

## 2013-04-29 NOTE — ED Provider Notes (Signed)
CSN: [id number]     Arrival date & time 04/29/13  1309 History   First MD Initiated Contact with Patient 04/29/13 1333     Chief Complaint  Patient presents with  . Headache  . Abdominal Pain   (Consider location/radiation/quality/duration/timing/severity/associated sxs/prior Treatment) Patient is a 27 y.o. male presenting with headaches and abdominal pain.  Headache Associated symptoms: abdominal pain   Abdominal Pain  Pt with history of DM poorly controlled on current regimen who does not have PCP reports increased blood sugar the past few days, associated with headache, nausea and moderate R sided abdominal and back pain, non-radiating, not associated with fever or dysuria.   Past Medical History  Diagnosis Date  . Diabetes mellitus   . Sleep apnea    Past Surgical History  Procedure Laterality Date  . Tonsillectomy     History reviewed. No pertinent family history. History  Substance Use Topics  . Smoking status: Never Smoker   . Smokeless tobacco: Not on file  . Alcohol Use: Yes     Comment: occasionally    Review of Systems  Gastrointestinal: Positive for abdominal pain.  Neurological: Positive for headaches.   All other systems reviewed and are negative except as noted in HPI.   Allergies  Other  Home Medications   Current Outpatient Rx  Name  Route  Sig  Dispense  Refill  . insulin glargine (LANTUS) 100 UNIT/ML injection   Subcutaneous   Inject 36 Units into the skin every morning.          . metFORMIN (GLUCOPHAGE) 1000 MG tablet   Oral   Take 1,000 mg by mouth 2 (two) times daily with a meal.          BP 136/75  Pulse 96  Temp(Src) 98.1 F (36.7 C) (Oral)  Resp 18  SpO2 97% Physical Exam  Nursing note and vitals reviewed. Constitutional: He is oriented to person, place, and time. He appears well-developed and well-nourished.  HENT:  Head: Normocephalic and atraumatic.  Eyes: EOM are normal. Pupils are equal, round, and reactive to  light.  Neck: Normal range of motion. Neck supple.  Cardiovascular: Normal rate, normal heart sounds and intact distal pulses.   Pulmonary/Chest: Effort normal and breath sounds normal.  Abdominal: Bowel sounds are normal. He exhibits no distension. There is tenderness (RUQ and R lateral abdomen). There is no rebound and no guarding.  Genitourinary:  No CVA tenderness to percussion  Musculoskeletal: Normal range of motion. He exhibits tenderness (R lower back). He exhibits no edema.  Neurological: He is alert and oriented to person, place, and time. He has normal strength. No cranial nerve deficit or sensory deficit.  Skin: Skin is warm and dry. No rash noted.  Psychiatric: He has a normal mood and affect.    ED Course  Procedures (including critical care time) Labs Review Labs Reviewed  GLUCOSE, CAPILLARY - Abnormal; Notable for the following:    Glucose-Capillary 532 (*)    All other components within normal limits  COMPREHENSIVE METABOLIC PANEL - Abnormal; Notable for the following:    Sodium 133 (*)    Glucose, Bld 530 (*)    Total Bilirubin 0.2 (*)    All other components within normal limits  URINALYSIS, ROUTINE W REFLEX MICROSCOPIC - Abnormal; Notable for the following:    Specific Gravity, Urine <1.005 (*)    Glucose, UA >1000 (*)    All other components within normal limits  GLUCOSE, CAPILLARY - Abnormal; Notable for the  following:    Glucose-Capillary 334 (*)    All other components within normal limits  GLUCOSE, CAPILLARY - Abnormal; Notable for the following:    Glucose-Capillary 231 (*)    All other components within normal limits  CBC  URINE MICROSCOPIC-ADD ON   Imaging Review No results found.  EKG Interpretation   None       MDM  No diagnosis found.  Hyperglycemia without signs of DKA. Pt found eating a hotdog and french fries in the room. Advised to avoid any further PO intake until his sugar is better controlled. IVF and insulin given, will need  recheck. Care signed out at the change of shift.     Charles B. Bernette Mayers, MD 04/30/13 [id number]

## 2013-04-29 NOTE — ED Notes (Signed)
Pt found in room eating hot dog and french fries. Pt informed to not eat until notified by staff.

## 2013-06-06 ENCOUNTER — Emergency Department (HOSPITAL_COMMUNITY): Payer: Self-pay

## 2013-06-06 ENCOUNTER — Emergency Department (HOSPITAL_COMMUNITY)
Admission: EM | Admit: 2013-06-06 | Discharge: 2013-06-06 | Disposition: A | Discharge status: Home / Self Care | Payer: Self-pay | Attending: Emergency Medicine | Admitting: Emergency Medicine

## 2013-06-06 ENCOUNTER — Encounter (HOSPITAL_COMMUNITY): Payer: Self-pay | Admitting: Emergency Medicine

## 2013-06-06 DIAGNOSIS — R739 Hyperglycemia, unspecified: Secondary | ICD-10-CM

## 2013-06-06 DIAGNOSIS — M25569 Pain in unspecified knee: Secondary | ICD-10-CM | POA: Insufficient documentation

## 2013-06-06 DIAGNOSIS — M25561 Pain in right knee: Secondary | ICD-10-CM

## 2013-06-06 DIAGNOSIS — F17200 Nicotine dependence, unspecified, uncomplicated: Secondary | ICD-10-CM | POA: Insufficient documentation

## 2013-06-06 DIAGNOSIS — Z91199 Patient's noncompliance with other medical treatment and regimen due to unspecified reason: Secondary | ICD-10-CM | POA: Insufficient documentation

## 2013-06-06 DIAGNOSIS — Z9119 Patient's noncompliance with other medical treatment and regimen: Secondary | ICD-10-CM | POA: Insufficient documentation

## 2013-06-06 DIAGNOSIS — E119 Type 2 diabetes mellitus without complications: Secondary | ICD-10-CM | POA: Insufficient documentation

## 2013-06-06 DIAGNOSIS — Z79899 Other long term (current) drug therapy: Secondary | ICD-10-CM | POA: Insufficient documentation

## 2013-06-06 DIAGNOSIS — Z794 Long term (current) use of insulin: Secondary | ICD-10-CM | POA: Insufficient documentation

## 2013-06-06 LAB — GLUCOSE, CAPILLARY
Glucose-Capillary: 472 mg/dL — ABNORMAL HIGH (ref 70–99)
Glucose-Capillary: 255 mg/dL — ABNORMAL HIGH (ref 70–99)

## 2013-06-06 LAB — CBC WITH DIFFERENTIAL/PLATELET
Basophils Absolute: 0.1 10*3/uL (ref 0.0–0.1)
Basophils Relative: 1 % (ref 0–1)
Eosinophils Relative: 3 % (ref 0–5)
HCT: 43 % (ref 39.0–52.0)
Hemoglobin: 15 g/dL (ref 13.0–17.0)
Lymphocytes Relative: 21 % (ref 12–46)
MCHC: 34.9 g/dL (ref 30.0–36.0)
MCV: 88.1 fL (ref 78.0–100.0)
Monocytes Absolute: 0.5 10*3/uL (ref 0.1–1.0)
Monocytes Relative: 6 % (ref 3–12)
RDW: 11.5 % (ref 11.5–15.5)

## 2013-06-06 LAB — POCT I-STAT, CHEM 8
BUN: 16 mg/dL (ref 6–23)
Calcium, Ion: 1.27 mmol/L — ABNORMAL HIGH (ref 1.12–1.23)
Glucose, Bld: 603 mg/dL (ref 70–99)
TCO2: 26 mmol/L (ref 0–100)

## 2013-06-06 MED ORDER — SODIUM CHLORIDE 0.9 % IV BOLUS (SEPSIS)
1000.0000 mL | Freq: Once | INTRAVENOUS | Status: AC
  Administered 2013-06-06: 1000 mL via INTRAVENOUS

## 2013-06-06 MED ORDER — OXYCODONE-ACETAMINOPHEN 5-325 MG PO TABS: 1.0000 | ORAL_TABLET | ORAL | Status: DC | PRN

## 2013-06-06 MED ORDER — OXYCODONE-ACETAMINOPHEN 5-325 MG PO TABS: 1.0000 | ORAL_TABLET | Freq: Once | ORAL | Status: AC

## 2013-06-06 MED ORDER — SODIUM CHLORIDE 0.9 % IV SOLN
INTRAVENOUS | Status: DC
  Administered 2013-06-06: 4.1 [IU]/h via INTRAVENOUS
  Filled 2013-06-06: qty 1

## 2013-06-06 MED ORDER — OXYCODONE-ACETAMINOPHEN 5-325 MG PO TABS
1.0000 | ORAL_TABLET | Freq: Once | ORAL | Status: AC
  Administered 2013-06-06: 1 via ORAL
  Filled 2013-06-06: qty 1

## 2013-06-06 NOTE — ED Notes (Signed)
The pt s glucose is 255.  Glucose stabilizer rports to increase the insulin to 5.9.  done

## 2013-06-06 NOTE — ED Notes (Signed)
The pt is sleeping 

## 2013-06-06 NOTE — ED Notes (Signed)
Pt returned from xray

## 2013-06-06 NOTE — ED Provider Notes (Signed)
Pt care transferred at 4pm from [facility]. See her note for full details. Briefly pt is a 37 M DM poor control. No PCP. Comes with benign knee pain. XR normal. Full ROM, not signs infection etc. Rec nsaid, rest and ice. Ortho f/u. Glucose on arrival 500+. On insulin drip. Declining now. Will d/c when under 200. Social worker seen in his PCP.  5:22 PM Recheck glucose under 200. Reexam patient appears well-developed distress. Understands PCP appointment. States will follow up. Patient appropriate for discharge.     Bridgett Larsson, MD 06/06/13 [phone]

## 2013-06-06 NOTE — ED Notes (Signed)
Took Lantus 35 Units and Metformin 1000mg  at 3am.

## 2013-06-06 NOTE — ED Notes (Signed)
Water given to pt; applejuice to family. Pt instructed not to eat or drink anything other than water.

## 2013-06-06 NOTE — ED Provider Notes (Signed)
CSN: [id number]     Arrival date & time 06/06/13  1019 History   First MD Initiated Contact with Patient 06/06/13 1058     Chief Complaint  Patient presents with  . Knee Pain  . Hyperglycemia   (Consider location/radiation/quality/duration/timing/severity/associated sxs/prior Treatment) HPI Comments: Patient is poorly controlled DM who presents to the ED with complaints of bilateral knee pain for the past 3 days and also noting that his blood sugars have been elevated as well.  This morning he reports that he awoke at 0300 and checked his sugar which was noted to be 420.  He states that he gave himself his lantus and his novolog.  States that he went back to bed then woke again at 1100 and his blood sugar was still above 400.  He reports has been taking his medication as prescribed and denies any change in his diet.  He has been known in the past for non-compliance with medication regimen as well as diet control.  He states he no longer has a PCP.  He reports increased thirst and urination.  He reports no injury to his knees and no previous history of knee or joint pain.  He states that he took some of his sister's percocets and they helped for about 1 hour then the pain returns.  He reports pain with movement of the knee but in particular with full extension.  Denies redness, fever, chills, swelling or effusion.  Patient is a 27 y.o. male presenting with knee pain and hyperglycemia. The history is provided by the patient. No language interpreter was used.  Knee Pain Location:  Knee Time since incident:  3 days Injury: no   Knee location:  L knee and R knee (bilateral) Pain details:    Quality:  Aching and pressure   Radiates to:  Does not radiate   Severity:  Severe   Onset quality:  Gradual   Duration:  3 days   Timing:  Constant   Progression:  Worsening Chronicity:  New Dislocation: no   Prior injury to area:  No Worsened by:  Extension Ineffective treatments:  None  tried Associated symptoms: decreased ROM and stiffness   Associated symptoms: no back pain, no fatigue, no fever, no itching, no muscle weakness, no neck pain, no numbness, no swelling and no tingling   Risk factors: obesity   Hyperglycemia Associated symptoms: no fatigue and no fever     Past Medical History  Diagnosis Date  . Diabetes mellitus   . Sleep apnea    Past Surgical History  Procedure Laterality Date  . Tonsillectomy     History reviewed. No pertinent family history. History  Substance Use Topics  . Smoking status: Current Every Day Smoker  . Smokeless tobacco: Not on file  . Alcohol Use: Yes     Comment: occasionally    Review of Systems  Constitutional: Negative for fever and fatigue.  Musculoskeletal: Positive for stiffness. Negative for back pain and neck pain.  Skin: Negative for itching.  All other systems reviewed and are negative.    Allergies  Other  Home Medications   Current Outpatient Rx  Name  Route  Sig  Dispense  Refill  . insulin aspart (NOVOLOG) 100 UNIT/ML injection   Subcutaneous   Inject 35 Units into the skin 3 (three) times daily before meals.         . insulin glargine (LANTUS) 100 UNIT/ML injection   Subcutaneous   Inject 35 Units into the skin every  morning.          . metFORMIN (GLUCOPHAGE) 1000 MG tablet   Oral   Take 1,000 mg by mouth 2 (two) times daily with a meal.         . oxyCODONE-acetaminophen (PERCOCET) 10-325 MG per tablet   Oral   Take 1 tablet by mouth every 6 (six) hours as needed for pain.          BP 133/90  Pulse 103  Temp(Src) 97.4 F (36.3 C) (Oral)  Resp 20  Ht 6\' 1"  (1.854 m)  Wt 276 lb 6.4 oz (125.374 kg)  BMI 36.47 kg/m2  SpO2 90% Physical Exam  Nursing note and vitals reviewed. Constitutional: He is oriented to person, place, and time. He appears well-developed and well-nourished. No distress.  HENT:  Head: Normocephalic and atraumatic.  Right Ear: External ear normal.  Left  Ear: External ear normal.  Nose: Nose normal.  Mouth/Throat: Oropharynx is clear and moist. No oropharyngeal exudate.  Eyes: Conjunctivae are normal. Pupils are equal, round, and reactive to light. No scleral icterus.  Neck: Normal range of motion. Neck supple.  Cardiovascular: Normal rate, regular rhythm and normal heart sounds.  Exam reveals no gallop and no friction rub.   No murmur heard. Pulmonary/Chest: Effort normal and breath sounds normal. No respiratory distress. He has no wheezes. He has no rales. He exhibits no tenderness.  Abdominal: Soft. Bowel sounds are normal. He exhibits no distension. There is no tenderness. There is no rebound and no guarding.  Musculoskeletal:       Right knee: He exhibits decreased range of motion. He exhibits no swelling, no effusion, no deformity, no erythema and normal alignment. Tenderness found. Patellar tendon tenderness noted.       Left knee: He exhibits decreased range of motion. He exhibits no swelling, no effusion, no erythema and normal alignment. Tenderness found. Patellar tendon tenderness noted.       Legs: Lymphadenopathy:    He has no cervical adenopathy.  Neurological: He is alert and oriented to person, place, and time. He exhibits normal muscle tone. Coordination normal.  Skin: Skin is warm and dry. No rash noted. No erythema. No pallor.  Psychiatric: He has a normal mood and affect. His behavior is normal. Judgment and thought content normal.    ED Course  Procedures (including critical care time) Labs Review Labs Reviewed  GLUCOSE, CAPILLARY - Abnormal; Notable for the following:    Glucose-Capillary 561 (*)    All other components within normal limits  CBC WITH DIFFERENTIAL   Imaging Review No results found.  EKG Interpretation   None      Results for orders placed during the hospital encounter of 06/06/13  GLUCOSE, CAPILLARY      Result Value Range   Glucose-Capillary 561 (*) 70 - 99 mg/dL   Comment 1 Documented  in Chart     Comment 2 Notify RN    CBC WITH DIFFERENTIAL      Result Value Range   WBC 8.0  4.0 - 10.5 K/uL   RBC 4.88  4.22 - 5.81 MIL/uL   Hemoglobin 15.0  13.0 - 17.0 g/dL   HCT 16.1  09.6 - 04.5 %   MCV 88.1  78.0 - 100.0 fL   MCH 30.7  26.0 - 34.0 pg   MCHC 34.9  30.0 - 36.0 g/dL   RDW 40.9  81.1 - 91.4 %   Platelets 269  150 - 400 K/uL   Neutrophils Relative %  69  43 - 77 %   Neutro Abs 5.5  1.7 - 7.7 K/uL   Lymphocytes Relative 21  12 - 46 %   Lymphs Abs 1.7  0.7 - 4.0 K/uL   Monocytes Relative 6  3 - 12 %   Monocytes Absolute 0.5  0.1 - 1.0 K/uL   Eosinophils Relative 3  0 - 5 %   Eosinophils Absolute 0.2  0.0 - 0.7 K/uL   Basophils Relative 1  0 - 1 %   Basophils Absolute 0.1  0.0 - 0.1 K/uL  GLUCOSE, CAPILLARY      Result Value Range   Glucose-Capillary 472 (*) 70 - 99 mg/dL  GLUCOSE, CAPILLARY      Result Value Range   Glucose-Capillary 401 (*) 70 - 99 mg/dL  GLUCOSE, CAPILLARY      Result Value Range   Glucose-Capillary 301 (*) 70 - 99 mg/dL  POCT I-STAT, CHEM 8      Result Value Range   Sodium 134 (*) 135 - 145 mEq/L   Potassium 4.2  3.5 - 5.1 mEq/L   Chloride 97  96 - 112 mEq/L   BUN 16  6 - 23 mg/dL   Creatinine, Ser 5.63  0.50 - 1.35 mg/dL   Glucose, Bld 875 (*) 70 - 99 mg/dL   Calcium, Ion 6.43 (*) 1.12 - 1.23 mmol/L   TCO2 26  0 - 100 mmol/L   Hemoglobin 16.0  13.0 - 17.0 g/dL   HCT 32.9  51.8 - 84.1 %   Dg Knee Complete 4 Views Left  06/06/2013   CLINICAL DATA:  Bilateral knee pain for 4 days.  No known injury.  EXAM: LEFT KNEE - COMPLETE 4+ VIEW  COMPARISON:  Right knee radiographs 06/06/2013  FINDINGS: There is no evidence of fracture, dislocation, or joint effusion. Joint spaces are maintained. There is some cortical thickening of the anterior tuberosity of the tibia, likely chronic. No focal soft tissue swelling is seen. Soft tissues are unremarkable.  IMPRESSION: No acute bony abnormality or joint effusion. Cortical thickening of the anterior  tuberosity, likely chronic.   Electronically Signed   By: Britta Mccreedy M.D.   On: 06/06/2013 12:28   Dg Knee Complete 4 Views Right  06/06/2013   EXAM: RIGHT KNEE - COMPLETE 4+ VIEW  COMPARISON:  None.  FINDINGS: There is no evidence of fracture, dislocation, or joint effusion. There is no evidence of arthropathy or other focal bone abnormality. Soft tissues are unremarkable.  IMPRESSION: Negative.   Electronically Signed   By: Maisie Fus  Register   On: 06/06/2013 12:22     3:31 PM Continues to complain of bilateral knee pain.  Blood sugar down to 301, will continue monitoring the patient until it reaches 200.  I have spoken with Case Management and they are assisting the patient with getting an orange card, he will be referred to the [facility] to establish PCP care and diabetic counseling as he does not coorelate changes in his diet with increases in his blood sugar.  X-rays of the knees do not show any real cause for the pain.  He may then also follow up with orthopedics as well.  Will give short course of pain medication. MDM  Hyperglycemia Bilateral knee pain  Patient is a poorly controlled diabetic who presents with main complaint of being bilateral knee pain, x-ray without concerning issues, doubt effusion, septic joint at this time.  Do not believe acute intervention is warranted.  Noted with hyperglycemia, initial blood sugar 500 here, trending down.  There is no evidence of DKA, gap 17 here, no acidosis, tolerating po food and fluids so I do not believe admission is warranted at this time.  Plan to get blood sugar to 200, then discharge the patient home.  Have gotten case management involved to help with getting the patient an orange card and getting PCP with [facility].  He is in agreement with this plan.     Izola Price Marisue Humble, PA-C 06/06/13 1558

## 2013-06-06 NOTE — ED Notes (Signed)
Pt c/o bilateral knee pain x3 days and elevated blood sugar starting yesterday. Pt reports his BS this am was 420

## 2013-06-06 NOTE — ED Notes (Signed)
The pt is up walking around in the room.  He wants something else for pain.  As reported from the day shift rn the pa will not give any more pain med at present.  Message given.  Water given iv running as   Ordered with insulin drip

## 2013-06-06 NOTE — Care Management Note (Addendum)
ED CM Noted pt to have 3 ED visits in the last 6 months. ED CM in to speak with patient. Pt states, he does not have a PCP to address health care needs at this time. Pt is unemployed and without health insurance. Provided written and verbal information about the [facility]. Forwarded information with patient's permission to make appointment for [facility].  Upon reviewing signs and symptoms of DKA and diabetic  dietary guidelines. It was identified that patient may need some addition education to better help manage his diabetes. Pt  Also agrees, Pt Lives with mother at home who is also a diabetic on insulin patient reports, he has been sharing mom's insulin. Explained the importance and dangers of sharing medication that is not prescribed for the patient. Also, reviewed the importance and benefits of having a PCP, and not using ED for Primary Care. Pt is in agreement with plan. Provided patient with information about the [facility] ph. [phone]. Referral placed. Pt given address and number to the [facility] Address: [address], [place], [place] [phone] Office will call to schedule an appointment. Discussed plan with Cherrie Distance PA-C on Pod B. Pt verbalized understanding employed the teach back method and agrees with plan. ED CM will continue to follow up.

## 2013-06-06 NOTE — ED Notes (Signed)
States has bilateral knee pain -- no known trauma or injury. No swelling noted--painful on palpation over patellar region. Also states "sugar has been high since yesterday"

## 2013-06-06 NOTE — ED Notes (Signed)
Patient transported to X-ray 

## 2013-06-07 NOTE — ED Provider Notes (Signed)
I agree with plan, signed out with poor DM control.  Enid Skeens   Enid Skeens, MD 06/07/13 [phone]

## 2013-06-08 NOTE — ED Provider Notes (Signed)
History/physical exam/procedure(s) were performed by non-physician practitioner and as supervising physician I was immediately available for consultation/collaboration. I have reviewed all notes and am in agreement with care and plan.   Ahmad Vanwey S Sakshi Sermons, MD 06/08/13 0802 

## 2013-06-16 ENCOUNTER — Ambulatory Visit: Payer: Self-pay

## 2013-06-21 ENCOUNTER — Ambulatory Visit: Payer: Self-pay

## 2013-08-11 ENCOUNTER — Ambulatory Visit: Payer: Self-pay | Admitting: *Deleted

## 2013-11-19 ENCOUNTER — Ambulatory Visit: Payer: Self-pay | Admitting: Dietician

## 2014-04-17 ENCOUNTER — Emergency Department (HOSPITAL_COMMUNITY)
Admission: EM | Admit: 2014-04-17 | Discharge: 2014-04-17 | Disposition: A | Discharge status: Home / Self Care | Payer: Self-pay | Attending: Emergency Medicine | Admitting: Emergency Medicine

## 2014-04-17 ENCOUNTER — Encounter (HOSPITAL_COMMUNITY): Payer: Self-pay | Admitting: Emergency Medicine

## 2014-04-17 ENCOUNTER — Emergency Department (HOSPITAL_COMMUNITY): Payer: Self-pay

## 2014-04-17 DIAGNOSIS — Z87891 Personal history of nicotine dependence: Secondary | ICD-10-CM | POA: Insufficient documentation

## 2014-04-17 DIAGNOSIS — Z8669 Personal history of other diseases of the nervous system and sense organs: Secondary | ICD-10-CM | POA: Insufficient documentation

## 2014-04-17 DIAGNOSIS — Z794 Long term (current) use of insulin: Secondary | ICD-10-CM | POA: Insufficient documentation

## 2014-04-17 DIAGNOSIS — M79671 Pain in right foot: Secondary | ICD-10-CM | POA: Insufficient documentation

## 2014-04-17 DIAGNOSIS — E1140 Type 2 diabetes mellitus with diabetic neuropathy, unspecified: Secondary | ICD-10-CM | POA: Insufficient documentation

## 2014-04-17 DIAGNOSIS — Z79899 Other long term (current) drug therapy: Secondary | ICD-10-CM | POA: Insufficient documentation

## 2014-04-17 LAB — CBC
HCT: 42.6 % (ref 39.0–52.0)
Hemoglobin: 14.7 g/dL (ref 13.0–17.0)
MCH: 31.2 pg (ref 26.0–34.0)
MCHC: 34.5 g/dL (ref 30.0–36.0)
MCV: 90.4 fL (ref 78.0–100.0)
Platelets: 320 10*3/uL (ref 150–400)
RBC: 4.71 MIL/uL (ref 4.22–5.81)
RDW: 11.9 % (ref 11.5–15.5)
WBC: 9.4 10*3/uL (ref 4.0–10.5)

## 2014-04-17 LAB — COMPREHENSIVE METABOLIC PANEL
ALT: 27 U/L (ref 0–53)
AST: 28 U/L (ref 0–37)
Albumin: 4 g/dL (ref 3.5–5.2)
Alkaline Phosphatase: 64 U/L (ref 39–117)
Anion gap: 16 — ABNORMAL HIGH (ref 5–15)
BUN: 12 mg/dL (ref 6–23)
CALCIUM: 9.4 mg/dL (ref 8.4–10.5)
CO2: 24 meq/L (ref 19–32)
Chloride: 96 mEq/L (ref 96–112)
Creatinine, Ser: 1.06 mg/dL (ref 0.50–1.35)
GFR calc Af Amer: >90 mL/min (ref >90)
GFR calc non Af Amer: >90 mL/min (ref >90)
Glucose, Bld: 207 mg/dL — ABNORMAL HIGH (ref 70–99)
Potassium: 4.5 mEq/L (ref 3.7–5.3)
SODIUM: 136 meq/L — AB (ref 137–147)
TOTAL PROTEIN: 7.9 g/dL (ref 6.0–8.3)
Total Bilirubin: 0.7 mg/dL (ref 0.3–1.2)

## 2014-04-17 LAB — CBG MONITORING, ED: Glucose-Capillary: 189 mg/dL — ABNORMAL HIGH (ref 70–99)

## 2014-04-17 MED ORDER — MORPHINE SULFATE 4 MG/ML IJ SOLN: 4.0000 mg | Freq: Once | INTRAMUSCULAR | Status: DC

## 2014-04-17 MED ORDER — OXYCODONE-ACETAMINOPHEN 5-325 MG PO TABS
1.0000 | ORAL_TABLET | Freq: Once | ORAL | Status: AC
  Administered 2014-04-17: 1 via ORAL
  Filled 2014-04-17: qty 1

## 2014-04-17 MED ORDER — OXYCODONE-ACETAMINOPHEN 5-325 MG PO TABS: 1.0000 | ORAL_TABLET | Freq: Four times a day (QID) | ORAL | Status: DC | PRN

## 2014-04-17 MED ORDER — MORPHINE SULFATE 4 MG/ML IJ SOLN
4.0000 mg | Freq: Once | INTRAMUSCULAR | Status: AC
  Administered 2014-04-17: 4 mg via INTRAMUSCULAR
  Filled 2014-04-17: qty 1

## 2014-04-17 NOTE — Discharge Instructions (Signed)
Return here as needed. Follow up with your doctor. Ice and elevate the foot. °

## 2014-04-17 NOTE — ED Provider Notes (Signed)
Complaint of right foot pain no on awakening this morning. No injury no other associated symptoms no fever no treatment prior to coming here. On exam patient is alert no distress right lower extremity no redness no warmth or temperature differential as compared to left foot. He is tender over the dorsum of foot. DP pulse 2+ capillary refill  Doug SouSam Kalene Cutler, MD 04/17/14 2013

## 2014-04-17 NOTE — ED Notes (Signed)
Pt reports he is diabetic woke up this AM with swollen, painful, red right foot. Pt denies recent injury. Denies fever.

## 2014-04-17 NOTE — ED Provider Notes (Signed)
CSN: [id number]     Arrival date & time 04/17/14  1357 History   First MD Initiated Contact with Patient 04/17/14 1812     Chief Complaint  Patient presents with  . Foot Pain     (Consider location/radiation/quality/duration/timing/severity/associated sxs/prior Treatment) HPI Patient presents to the emergency department with right foot pain.  It started when he awoke.  This morning.  The patient, states, that he's had some swelling and pain to the top of his foot.  The patient denies any injury.  Patient, states, that he does have diabetic neuropathy, but has not had any wounds to his feet.  Patient denies fever, nausea, vomiting, headache, blurred vision, weakness, dizziness, lightheadedness, rash, chest pain, shortness of breath, or syncope.  The patient, states, that he didn't take any medications prior to arrival.  Patient, states, that he is unable to walk due to the pain.  Patient, states, that pressure and palpation make the pain, worse. Past Medical History  Diagnosis Date  . Diabetes mellitus   . Sleep apnea    Past Surgical History  Procedure Laterality Date  . Tonsillectomy     No family history on file. History  Substance Use Topics  . Smoking status: Former Games developermoker  . Smokeless tobacco: Not on file  . Alcohol Use: Yes     Comment: 2x/week    Review of Systems  All other systems negative except as documented in the HPI. All pertinent positives and negatives as reviewed in the HPI.   Allergies  Other  Home Medications   Prior to Admission medications   Medication Sig Start Date End Date Taking? Authorizing Provider  insulin aspart (NOVOLOG) 100 UNIT/ML injection Inject 35 Units into the skin 3 (three) times daily before meals.   Yes Historical Provider, MD  insulin glargine (LANTUS) 100 UNIT/ML injection Inject 35 Units into the skin every morning.    Yes Historical Provider, MD  metFORMIN (GLUCOPHAGE) 1000 MG tablet Take 1,000 mg by mouth 2 (two) times daily  with a meal.   Yes Historical Provider, MD  oxyCODONE-acetaminophen (PERCOCET) 10-325 MG per tablet Take 1 tablet by mouth every 6 (six) hours as needed for pain.   Yes Historical Provider, MD   BP 139/85  Pulse 91  Temp(Src) 98.1 F (36.7 C) (Oral)  Resp 16  SpO2 98% Physical Exam  Constitutional: He is oriented to person, place, and time. He appears well-developed and well-nourished.  HENT:  Head: Normocephalic and atraumatic.  Eyes: Pupils are equal, round, and reactive to light.  Neck: Normal range of motion. Neck supple.  Cardiovascular: Normal rate, regular rhythm and normal heart sounds.  Exam reveals no gallop and no friction rub.   No murmur heard. Pulmonary/Chest: Effort normal and breath sounds normal. No respiratory distress.  Musculoskeletal:       Feet:  Neurological: He is alert and oriented to person, place, and time. He exhibits normal muscle tone. Coordination normal.    ED Course  Procedures (including critical care time) Labs Review Labs Reviewed  COMPREHENSIVE METABOLIC PANEL - Abnormal; Notable for the following:    Sodium 136 (*)    Glucose, Bld 207 (*)    Anion gap 16 (*)    All other components within normal limits  CBG MONITORING, ED - Abnormal; Notable for the following:    Glucose-Capillary 189 (*)    All other components within normal limits  CBC    Imaging Review Dg Foot Complete Right  04/17/2014   CLINICAL DATA:  Unable to bear weight on foot.  Pain medially.  EXAM: RIGHT FOOT COMPLETE - 3+ VIEW  COMPARISON:  None.  FINDINGS: Frontal, oblique, and lateral views were obtained. There is no fracture or dislocation. Joint spaces appear intact. No erosive change.  IMPRESSION: No appreciable fracture or dislocation. No arthropathic change apparent.   Electronically Signed   By: Bretta BangWilliam  Woodruff M.D.   On: 04/17/2014 15:23     Patient be treated for undifferentiated foot pain.  Patient is advised followup with his primary care Dr. told to return  to the emergency department for any worsening in his condition.  Patient is advised ice and elevate his foot.  There is no signs of infection at this time   Carlyle DollyChristopher W Azalea Cedar, PA-C 04/18/14 1507

## 2014-04-18 NOTE — ED Provider Notes (Signed)
Medical screening examination/treatment/procedure(s) were conducted as a shared visit with non-physician practitioner(s) and myself.  I personally evaluated the patient during the encounter.   EKG Interpretation None       Doug SouSam Hedwig Mcfall, MD 04/18/14 1544

## 2014-09-06 ENCOUNTER — Encounter (HOSPITAL_COMMUNITY): Payer: Self-pay | Admitting: Emergency Medicine

## 2014-09-06 ENCOUNTER — Emergency Department (INDEPENDENT_AMBULATORY_CARE_PROVIDER_SITE_OTHER): Payer: Self-pay

## 2014-09-06 ENCOUNTER — Emergency Department (INDEPENDENT_AMBULATORY_CARE_PROVIDER_SITE_OTHER)
Admission: EM | Admit: 2014-09-06 | Discharge: 2014-09-06 | Disposition: A | Discharge status: Home / Self Care | Payer: Self-pay | Source: Home / Self Care | Attending: Emergency Medicine | Admitting: Emergency Medicine

## 2014-09-06 DIAGNOSIS — R0781 Pleurodynia: Secondary | ICD-10-CM

## 2014-09-06 MED ORDER — KETOROLAC TROMETHAMINE 60 MG/2ML IM SOLN
INTRAMUSCULAR | Status: AC
  Filled 2014-09-06: qty 2

## 2014-09-06 MED ORDER — DICLOFENAC SODIUM 50 MG PO TBEC: DELAYED_RELEASE_TABLET | ORAL | Status: DC

## 2014-09-06 MED ORDER — KETOROLAC TROMETHAMINE 60 MG/2ML IM SOLN
60.0000 mg | Freq: Once | INTRAMUSCULAR | Status: AC
  Administered 2014-09-06: 60 mg via INTRAMUSCULAR

## 2014-09-06 NOTE — Discharge Instructions (Signed)
Chest Wall Pain Chest wall pain is pain felt in or around the chest bones and muscles. It may take up to 6 weeks to get better. It may take longer if you are active. Chest wall pain can happen on its own. Other times, things like germs, injury, coughing, or exercise can cause the pain. HOME CARE   Avoid activities that make you tired or cause pain. Try not to use your chest, belly (abdominal), or side muscles. Do not use heavy weights.  Put ice on the sore area.  Put ice in a plastic bag.  Place a towel between your skin and the bag.  Leave the ice on for 15-20 minutes for the first 2 days.  Only take medicine as told by your doctor. GET HELP RIGHT AWAY IF:   You have more pain or are very uncomfortable.  You have a fever.  Your chest pain gets worse.  You have new problems.  You feel sick to your stomach (nauseous) or throw up (vomit).  You start to sweat or feel lightheaded.  You have a cough with mucus (phlegm).  You cough up blood. MAKE SURE YOU:   Understand these instructions.  Will watch your condition.  Will get help right away if you are not doing well or get worse. Document Released: 12/13/2007 Document Revised: 09/18/2011 Document Reviewed: 02/20/2011 [facility] Patient Information 2015 [place], [place]. This information is not intended to replace advice given to you by your health care provider. Make sure you discuss any questions you have with your health care provider.  Pleurodynia Pleurodynia is a sharp pain in the muscles between your ribs (intercostal muscles). This condition makes it painful to breathe. Pleurodynia is sometimes described as an iron grip around the rib cage. Pleurodynia attacks are unpredictable. CAUSES  Pleurodynia is commonly caused by a viral infection. A virus, called coxsackievirus B, attacks the intercostal muscles. However, getting pleurodynia from this virus is rare. Most people infected with coxsackievirus B have no symptoms. In  some people, the virus causes a mild sore throat, cough, or diarrhea. Coxsackievirus B can live in body fluids, such as saliva and mucus. It is easily spread from person to person through coughing or sneezing. Coming in contact with the stool of an infected person can also spread the virus. SYMPTOMS  Symptoms usually start 3 to 6 days after you have been infected with the virus. Very bad chest pain is the main symptom of pleurodynia. The pain is usually felt on one side of the body, along the lower ribs. It starts suddenly and may last from a few seconds to 1 minute. It is hard to breathe when the pain strikes. You might feel pain again a few minutes or hours later. In most cases, the pain keeps coming back for 3 to 5 days. Then it goes away. In some cases, the pain keeps coming back every so often for up to 1 month. Other symptoms of pleurodynia may include:   Fever.  Rapid heartbeat.  Sore throat.  Cough.  Headache.  Stomach pain.  Nausea.  Vomiting.  Diarrhea.  Feeling very tired.  Rash.  For males, pain in the testicles. DIAGNOSIS  If you have had very bad chest pain, your caregiver will probably order some tests to determine whether you have pleurodynia. These tests may include:  A throat swab. Your caregiver may rub the back of your throat with a cotton swab. The cotton swab can then be tested for coxsackievirus B.  Urine and  stool samples. These samples will be tested for coxsackievirus B.  Blood tests. These tests can tell if you have muscle damage.  Chest X-rays.  Electrocardiography (ECG). This test checks your heartbeat. TREATMENT  There is no treatment for an infection caused by coxsackievirus B. However, pleurodynia usually goes away on its own. It may take up to 1 month to fully recover. You may be given nonsteroidal anti-inflammatory drugs (NSAIDs) to control your pain. If your chest pain continues, you may need to see a pain specialist to discuss possibly  using nerve block injections to relieve your pain. HOME CARE INSTRUCTIONS  Only take over-the-counter or prescription medicines for pain, fever, or discomfort as directed by your caregiver.  Return to your regular activities slowly.  Wash your hands often. This helps prevent coxsackievirus B from spreading.  Do not smoke.  Keep all follow-up appointments as directed by your caregiver. SEEK MEDICAL CARE IF:  You have new symptoms.  Your symptoms are getting worse.  You develop a cough.  You have a sore throat.  You have a rash.  You have abdominal pain.  You vomit.  You have diarrhea. SEEK IMMEDIATE MEDICAL CARE IF:  You have very bad chest pain that is getting worse.  You have trouble breathing.  You have a fever. MAKE SURE YOU:  Understand these instructions.  Will watch your condition.  Will get help right away if you are not doing well or get worse. Document Released: 06/15/2011 Document Revised: 09/18/2011 Document Reviewed: 06/15/2011 [facility] Patient Information 2015 [place]. This information is not intended to replace advice given to you by your health care provider. Make sure you discuss any questions you have with your health care provider.

## 2014-09-06 NOTE — ED Provider Notes (Signed)
CSN: [id number]     Arrival date & time 09/06/14  1425 History   First MD Initiated Contact with Patient 09/06/14 1521     Chief Complaint  Patient presents with  . Chest Pain   (Consider location/radiation/quality/duration/timing/severity/associated sxs/prior Treatment) HPI Comments: Zoe Lanntonio is a 29 yo black male with a history of IDDM who presents with a 3 day history of chest pain. His pain is midline and "sharp"  with worsening by ROM, deep breaths or cough. He has not had respiratory symptoms. No fever or chills. No dyspnea or orthopnea.  No prior history. See remainder of ROS  Patient is a 29 y.o. male presenting with chest pain. The history is provided by the patient.  Chest Pain Associated symptoms: cough   Associated symptoms: no back pain, no diaphoresis, no fatigue, no fever and no palpitations     Past Medical History  Diagnosis Date  . Diabetes mellitus   . Sleep apnea    Past Surgical History  Procedure Laterality Date  . Tonsillectomy     History reviewed. No pertinent family history. History  Substance Use Topics  . Smoking status: Former Games developermoker  . Smokeless tobacco: Not on file  . Alcohol Use: Yes     Comment: 2x/week    Review of Systems  Constitutional: Negative for fever, chills, diaphoresis, activity change and fatigue.  HENT: Negative.   Respiratory: Positive for cough. Negative for chest tightness, wheezing and stridor.   Cardiovascular: Positive for chest pain. Negative for palpitations and leg swelling.  Gastrointestinal: Negative.   Genitourinary: Negative.   Musculoskeletal: Negative for back pain and arthralgias.  Skin: Negative.   Neurological: Negative.   Psychiatric/Behavioral: Negative.     Allergies  Other  Home Medications   Prior to Admission medications   Medication Sig Start Date End Date Taking? Authorizing Provider  insulin aspart (NOVOLOG) 100 UNIT/ML injection Inject 35 Units into the skin 3 (three) times daily before  meals.   Yes Historical Provider, MD  insulin glargine (LANTUS) 100 UNIT/ML injection Inject 35 Units into the skin every morning.    Yes Historical Provider, MD  metFORMIN (GLUCOPHAGE) 1000 MG tablet Take 1,000 mg by mouth 2 (two) times daily with a meal.   Yes Historical Provider, MD  diclofenac (VOLTAREN) 50 MG EC tablet 1 po bid pc x 7 days then if needed for chest pain 09/06/14   Riki SheerMichelle G Bernell Haynie, PA-C  oxyCODONE-acetaminophen (PERCOCET) 10-325 MG per tablet Take 1 tablet by mouth every 6 (six) hours as needed for pain.    Historical Provider, MD  oxyCODONE-acetaminophen (PERCOCET/ROXICET) 5-325 MG per tablet Take 1 tablet by mouth every 6 (six) hours as needed for severe pain. 04/17/14   Jamesetta Orleanshristopher W Lawyer, PA-C   BP 142/80 mmHg  Pulse 88  Temp(Src) 97.8 F (36.6 C) (Oral)  Resp 16  SpO2 97% Physical Exam  Constitutional: He is oriented to person, place, and time. He appears well-developed and well-nourished. No distress.  Obese  HENT:  Head: Normocephalic and atraumatic.  Pulmonary/Chest: Effort normal and breath sounds normal. No respiratory distress. He has no wheezes. He exhibits tenderness.  Chest tenderness along the para-sterum region. Worsens with ROM or inspiration  Musculoskeletal: He exhibits tenderness. He exhibits no edema.  Neurological: He is alert and oriented to person, place, and time.  Skin: Skin is warm and dry. No rash noted. He is not diaphoretic.  Psychiatric: His behavior is normal.  Nursing note and vitals reviewed.   ED Course  Procedures (including critical care time) Labs Review Labs Reviewed - No data to display  Imaging Review Dg Chest 2 View  09/06/2014   CLINICAL DATA:  Chest pain. Shortness of breath for 2 days. No history of asthma. Nonsmoker.  EXAM: CHEST  2 VIEW  COMPARISON:  None.  FINDINGS: The heart size and mediastinal contours are within normal limits. Both lungs are clear. The visualized skeletal structures are unremarkable.   IMPRESSION: No active cardiopulmonary disease.   Electronically Signed   By: Norva Pavlov M.D.   On: 09/06/2014 16:20     MDM   1. Pleuritic chest pain    Toradol  IM given in clinic for pain and inflammation. CXR normal. Follow with NSAID daily. Hesitate to give steroids in the setting of DKA in the past and history of uncontrolled IDDM. He is educated today.     Riki Sheer, PA-C 09/06/14 [phone]

## 2014-09-06 NOTE — ED Notes (Signed)
C/o  Chest pain:  Pressure.  Sharp.  Productive cough.  Pain with movement.    Pain is felt in center of chest.  Denies any cardiac hx     Symptoms present x 3 days.   No otc meds taken.

## 2014-10-06 ENCOUNTER — Emergency Department (HOSPITAL_COMMUNITY): Payer: Self-pay

## 2014-10-06 ENCOUNTER — Emergency Department (HOSPITAL_COMMUNITY)
Admission: EM | Admit: 2014-10-06 | Discharge: 2014-10-06 | Disposition: A | Discharge status: Home / Self Care | Payer: Self-pay | Attending: Emergency Medicine | Admitting: Emergency Medicine

## 2014-10-06 ENCOUNTER — Encounter (HOSPITAL_COMMUNITY): Payer: Self-pay | Admitting: Emergency Medicine

## 2014-10-06 DIAGNOSIS — Z794 Long term (current) use of insulin: Secondary | ICD-10-CM | POA: Insufficient documentation

## 2014-10-06 DIAGNOSIS — M109 Gout, unspecified: Secondary | ICD-10-CM

## 2014-10-06 DIAGNOSIS — M10072 Idiopathic gout, left ankle and foot: Secondary | ICD-10-CM | POA: Insufficient documentation

## 2014-10-06 DIAGNOSIS — Z87891 Personal history of nicotine dependence: Secondary | ICD-10-CM | POA: Insufficient documentation

## 2014-10-06 DIAGNOSIS — Z79899 Other long term (current) drug therapy: Secondary | ICD-10-CM | POA: Insufficient documentation

## 2014-10-06 DIAGNOSIS — E1165 Type 2 diabetes mellitus with hyperglycemia: Secondary | ICD-10-CM | POA: Insufficient documentation

## 2014-10-06 DIAGNOSIS — Z8669 Personal history of other diseases of the nervous system and sense organs: Secondary | ICD-10-CM | POA: Insufficient documentation

## 2014-10-06 LAB — I-STAT CHEM 8, ED
BUN: 10 mg/dL (ref 6–23)
Calcium, Ion: 1.22 mmol/L (ref 1.12–1.23)
Chloride: 96 mmol/L (ref 96–112)
Creatinine, Ser: 0.8 mg/dL (ref 0.50–1.35)
GLUCOSE: 170 mg/dL — AB (ref 70–99)
HCT: 44 % (ref 39.0–52.0)
HEMOGLOBIN: 15 g/dL (ref 13.0–17.0)
Potassium: 3.9 mmol/L (ref 3.5–5.1)
Sodium: 137 mmol/L (ref 135–145)
TCO2: 27 mmol/L (ref 0–100)

## 2014-10-06 LAB — CBG MONITORING, ED: GLUCOSE-CAPILLARY: 159 mg/dL — AB (ref 70–99)

## 2014-10-06 MED ORDER — OXYCODONE-ACETAMINOPHEN 5-325 MG PO TABS
1.0000 | ORAL_TABLET | Freq: Once | ORAL | Status: AC
  Administered 2014-10-06: 1 via ORAL
  Filled 2014-10-06: qty 1

## 2014-10-06 MED ORDER — INDOMETHACIN 25 MG PO CAPS: 25.0000 mg | ORAL_CAPSULE | Freq: Three times a day (TID) | ORAL | Status: DC | PRN

## 2014-10-06 MED ORDER — COLCHICINE 0.6 MG PO TABS
1.2000 mg | ORAL_TABLET | Freq: Once | ORAL | Status: AC
  Administered 2014-10-06: 1.2 mg via ORAL
  Filled 2014-10-06: qty 2

## 2014-10-06 NOTE — ED Notes (Signed)
Pt c/o L ankle pain x 2 days; denies injury. Pt also reports blood sugars have been over 300 at home

## 2014-10-06 NOTE — ED Provider Notes (Signed)
CSN: [id number]     Arrival date & time 10/06/14  1544 History  This chart was scribed for Robert Battiest, NP, working with Doug Sou, MD by Elon Spanner, ED Scribe. This patient was seen in room TR08C/TR08C and the patient's care was started at 4:50 PM.   Chief Complaint  Patient presents with  . Hyperglycemia  . Ankle Pain   The history is provided by the patient. No language interpreter was used.   HPI Comments: Robert Burton is a 29 y.o. male who presents to the Emergency Department complaining of left ankle pain onset this morning when the patient awoke without injury.  Patient reports he had a similar episode October 2015 on his right ankle but is unsure of the diagnosis.  He has taken percocet without relief.  Patient denies use of diuretics.  Patient denies history of kidney conditions.  Patient denies penile discharge, penile pain.    Patient also reports a history of DM and complains of current hyperglycemia, measuring his blood sugar in the 200s today.  He reports he is prescribed and is compliant with metformin and lantis. He checks his blood sugar every other day and notes that this week it has been in the 300s.    PCP: Dr. Bruna Potter   Past Medical History  Diagnosis Date  . Diabetes mellitus   . Sleep apnea    Past Surgical History  Procedure Laterality Date  . Tonsillectomy     History reviewed. No pertinent family history. History  Substance Use Topics  . Smoking status: Former Games developer  . Smokeless tobacco: Not on file  . Alcohol Use: Yes     Comment: 2x/week    Review of Systems  Genitourinary: Negative for discharge and penile pain.  Musculoskeletal: Positive for joint swelling and arthralgias.      Allergies  Other  Home Medications   Prior to Admission medications   Medication Sig Start Date End Date Taking? Authorizing Provider  diclofenac (VOLTAREN) 50 MG EC tablet 1 po bid pc x 7 days then if needed for chest pain 09/06/14   Riki Sheer, PA-C  insulin aspart (NOVOLOG) 100 UNIT/ML injection Inject 35 Units into the skin 3 (three) times daily before meals.    Historical Provider, MD  insulin glargine (LANTUS) 100 UNIT/ML injection Inject 35 Units into the skin every morning.     Historical Provider, MD  metFORMIN (GLUCOPHAGE) 1000 MG tablet Take 1,000 mg by mouth 2 (two) times daily with a meal.    Historical Provider, MD  oxyCODONE-acetaminophen (PERCOCET) 10-325 MG per tablet Take 1 tablet by mouth every 6 (six) hours as needed for pain.    Historical Provider, MD  oxyCODONE-acetaminophen (PERCOCET/ROXICET) 5-325 MG per tablet Take 1 tablet by mouth every 6 (six) hours as needed for severe pain. 04/17/14   Christopher Lawyer, PA-C   BP 140/87 mmHg  Pulse 93  Temp(Src) 97.4 F (36.3 C) (Oral)  Resp 24  Ht  (1.854 m)  Wt 265 lb (120.203 kg)  BMI 34.97 kg/m2  SpO2 97% Physical Exam  Constitutional: He is oriented to person, place, and time. He appears well-developed and well-nourished. No distress.  HENT:  Head: Normocephalic and atraumatic.  Eyes: Conjunctivae and EOM are normal.  Neck: Neck supple. No tracheal deviation present.  Cardiovascular: Normal rate.   Pulmonary/Chest: Effort normal. No respiratory distress.  Musculoskeletal: Normal range of motion.  Swelling noted to the lateral malleolus on the left.    Neurological: He is  alert and oriented to person, place, and time.  Skin: Skin is warm and dry.  Psychiatric: He has a normal mood and affect. His behavior is normal.  Nursing note and vitals reviewed.   ED Course  Procedures (including critical care time)  DIAGNOSTIC STUDIES: Oxygen Saturation is 97% on RA, normal by my interpretation.    COORDINATION OF CARE:  4:56 PM Discussed treatment plan with patient at bedside.  Patient acknowledges and agrees with plan.    Labs Review Labs Reviewed  CBG MONITORING, ED - Abnormal; Notable for the following:    Glucose-Capillary 159 (*)    All  other components within normal limits  I-STAT CHEM 8, ED - Abnormal; Notable for the following:    Glucose, Bld 170 (*)    All other components within normal limits    Imaging Review Dg Ankle Complete Left  10/06/2014   CLINICAL DATA:  Ankle pain and swelling.  EXAM: LEFT ANKLE COMPLETE - 3+ VIEW  COMPARISON:  None.  FINDINGS: There is no evidence of fracture, dislocation, or joint effusion. There is no evidence of arthropathy or other focal bone abnormality. Soft tissues are unremarkable.  IMPRESSION: Negative.   Electronically Signed   By: Elige KoHetal  Patel   On: 10/06/2014 16:48     EKG Interpretation None      MDM   Final diagnoses:  Gouty arthritis   29 yo with monoarticular pain, and swelling, no warmth or erythema noted however. Pt is afebrile and with no acute distress. His imaging is negative for acute abnormality, no evidence of occult fracture or injury. Renal function good. Pt without known peptic ulcer disease and not receiving concurrent treatment on warfarin. Colchicine given in the ED and Prescription for indomethacin provided.  Discussed that pt should respond to treatment with in 24 hour of begining treatment & likely resolve in 2-3 days. Pt is well-appearing, in no acute distress and vital signs reviewed and not concerning.  She appears safe to be discharged.  Discharge include follow-up with his PCP.  Return precautions provided. Pt aware of plan and in agreement.   I personally performed the services described in this documentation, which was scribed in my presence. The recorded information has been reviewed and is accurate.  Filed Vitals:   10/06/14 1553 10/06/14 1809  BP: 140/87 151/78  Pulse: 93 91  Temp: 97.4 F (36.3 C) 97.5 F (36.4 C)  TempSrc: Oral Oral  Resp: 24 22  Height: 6\' 1"  (1.854 m)   Weight: 265 lb (120.203 kg)   SpO2: 97% 99%   Meds given in ED:  Medications  oxyCODONE-acetaminophen (PERCOCET/ROXICET) 5-325 MG per tablet 1 tablet (1 tablet  Oral Given 10/06/14 1741)  colchicine tablet 1.2 mg (1.2 mg Oral Given 10/06/14 1807)    Discharge Medication List as of 10/06/2014  6:05 PM    START taking these medications   Details  indomethacin (INDOCIN) 25 MG capsule Take 1 capsule (25 mg total) by mouth 3 (three) times daily as needed., Starting 10/06/2014, Until Discontinued, Print            Robert BattiestElizabeth Yumiko Alkins, NP 10/08/14 [id number]  Doug SouSam Jacubowitz, MD 10/08/14 [phone]

## 2014-10-06 NOTE — Discharge Instructions (Signed)
Please follow the directions provided. Be sure to follow-up with your primary care doctor to ensure you're getting better. Please take the indomethacin 3 times a day as needed for the pain in your ankle. Drink plenty of fluids and eat a well-balanced diet. Don't hesitate to return for any new, worsening, or concerning symptoms.   SEEK MEDICAL CARE IF:  You develop diarrhea, vomiting, or any side effects from medicines.  You do not feel better in 24 hours, or you are getting worse. SEEK IMMEDIATE MEDICAL CARE IF:  Your joint becomes suddenly more tender, and you have chills or a fever.

## 2014-10-06 NOTE — ED Notes (Signed)
Pt sts some left ankle pain and hyperglycemia x 2 days; pt denies injury to ankle

## 2014-12-19 IMAGING — CR DG KNEE COMPLETE 4+V*L*
4 series · 4 of 4 positions shown · non-contrast
Comparison: Right knee radiographs 06/06/2013

CLINICAL DATA: Bilateral knee pain for 4 days.  No known injury.

EXAM:
LEFT KNEE - COMPLETE 4+ VIEW

[t knee ap left]
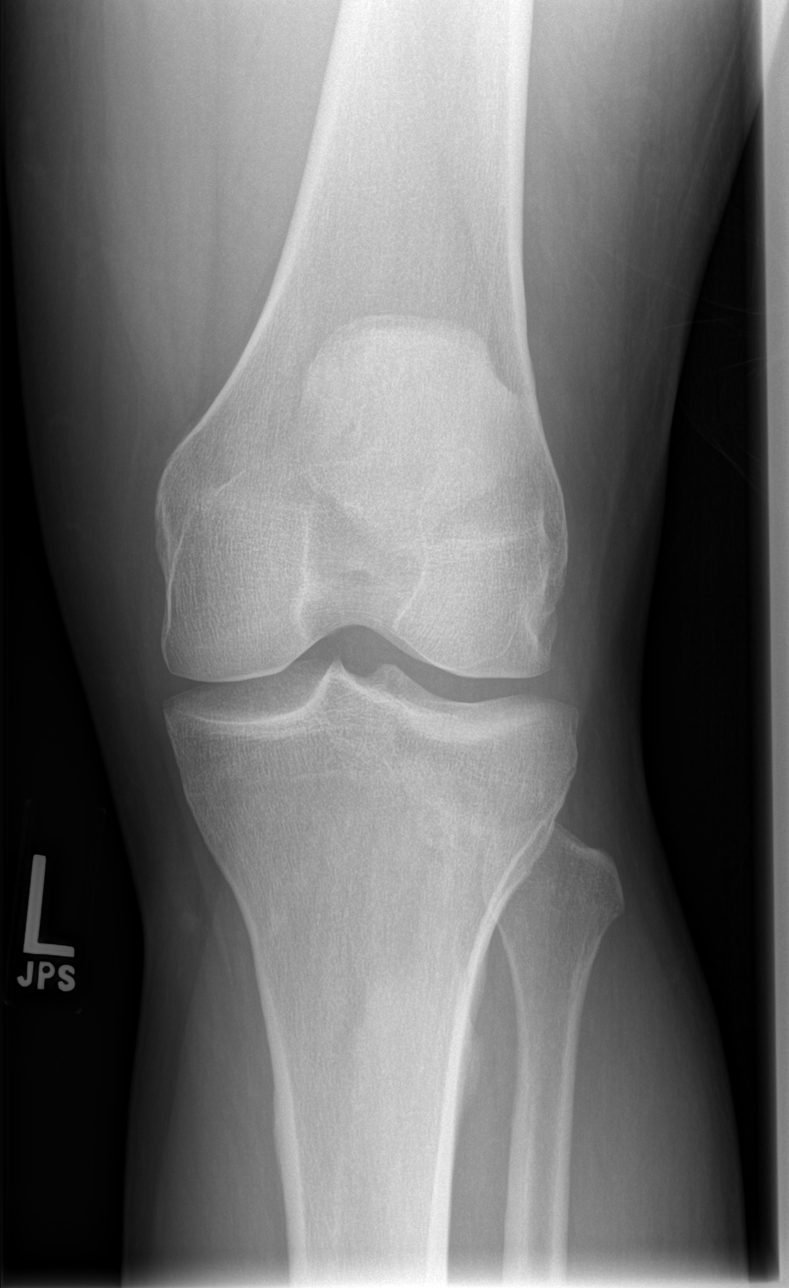

[t knee oblique left (1 of 2)]
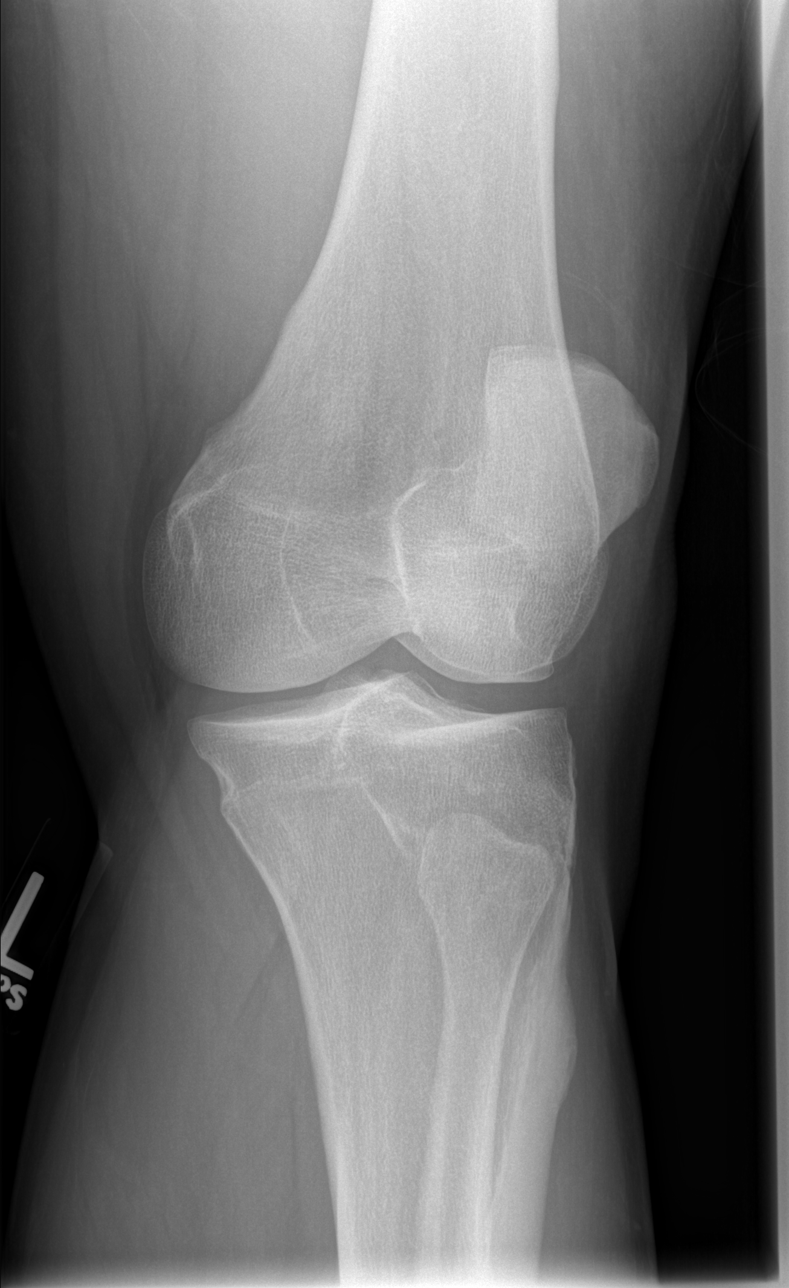

[t knee oblique left (2 of 2)]
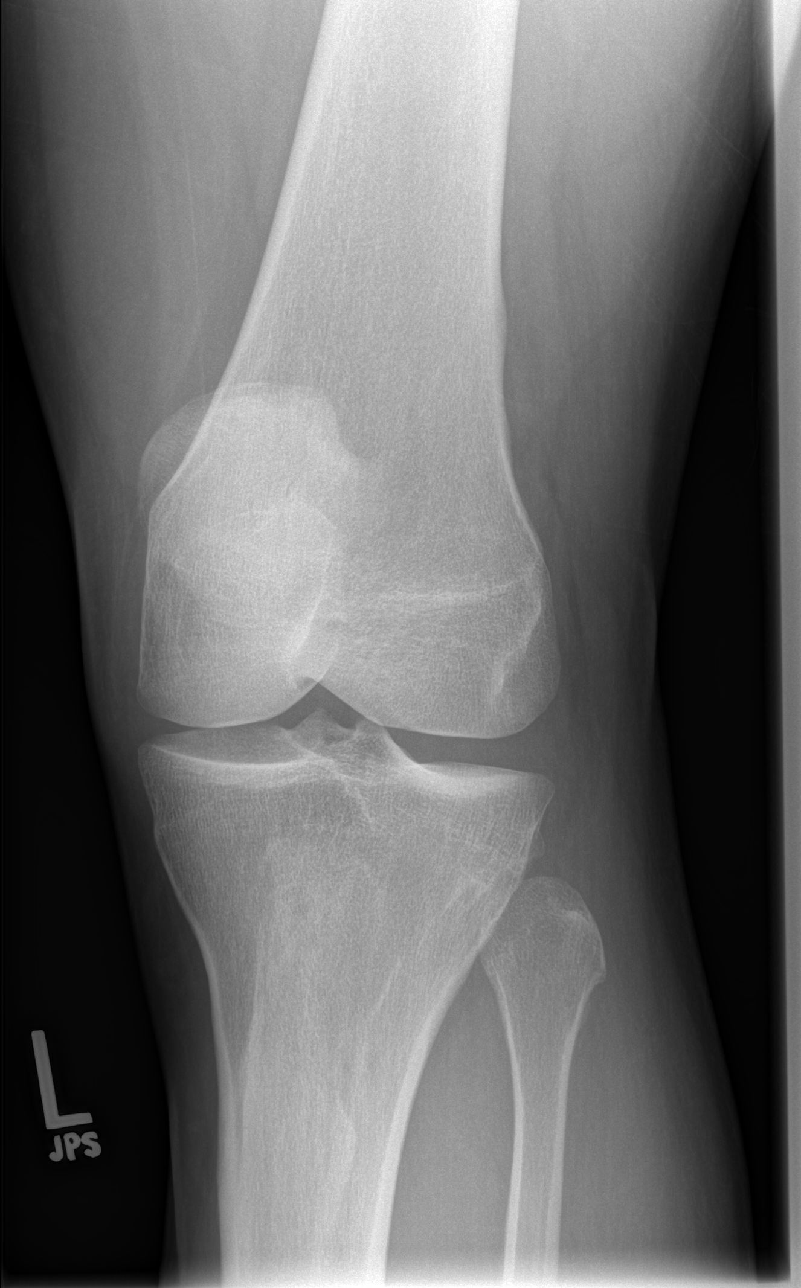

[t knee lat left]
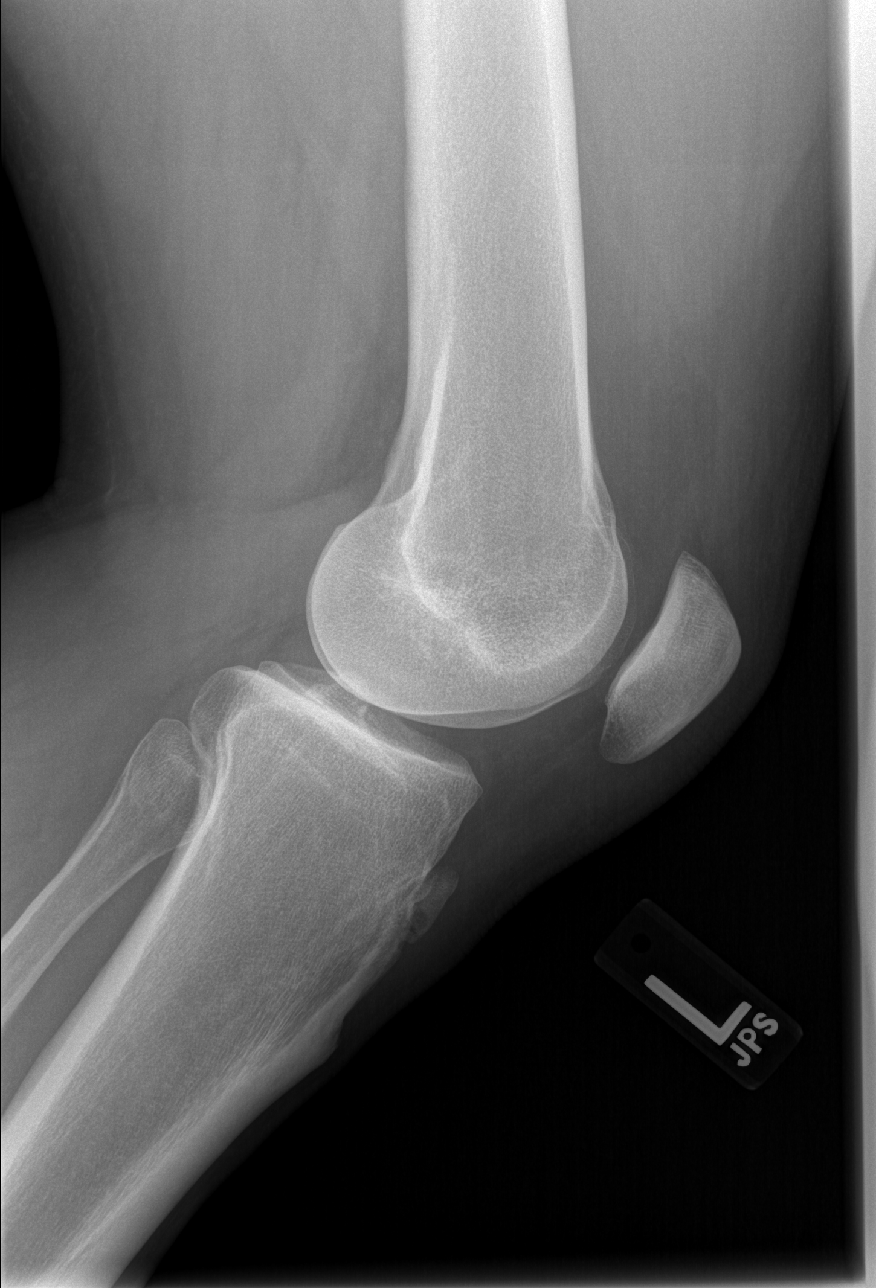

[4 of 4 positions shown; findings below may reference images not displayed]

FINDINGS: There is no evidence of fracture, dislocation, or joint effusion.
Joint spaces are maintained. There is some cortical thickening of
the anterior tuberosity of the tibia, likely chronic. No focal soft
tissue swelling is seen. Soft tissues are unremarkable.
IMPRESSION: No acute bony abnormality or joint effusion. Cortical thickening of
the anterior tuberosity, likely chronic.

## 2015-06-12 ENCOUNTER — Emergency Department (HOSPITAL_COMMUNITY)
Admission: EM | Admit: 2015-06-12 | Discharge: 2015-06-12 | Disposition: A | Discharge status: Home / Self Care | Payer: Self-pay | Attending: Emergency Medicine | Admitting: Emergency Medicine

## 2015-06-12 ENCOUNTER — Emergency Department (HOSPITAL_COMMUNITY): Payer: Self-pay

## 2015-06-12 ENCOUNTER — Encounter (HOSPITAL_COMMUNITY): Payer: Self-pay

## 2015-06-12 DIAGNOSIS — Z794 Long term (current) use of insulin: Secondary | ICD-10-CM | POA: Insufficient documentation

## 2015-06-12 DIAGNOSIS — Z8669 Personal history of other diseases of the nervous system and sense organs: Secondary | ICD-10-CM | POA: Insufficient documentation

## 2015-06-12 DIAGNOSIS — Z8709 Personal history of other diseases of the respiratory system: Secondary | ICD-10-CM | POA: Insufficient documentation

## 2015-06-12 DIAGNOSIS — E119 Type 2 diabetes mellitus without complications: Secondary | ICD-10-CM | POA: Insufficient documentation

## 2015-06-12 DIAGNOSIS — Z7984 Long term (current) use of oral hypoglycemic drugs: Secondary | ICD-10-CM | POA: Insufficient documentation

## 2015-06-12 DIAGNOSIS — F17210 Nicotine dependence, cigarettes, uncomplicated: Secondary | ICD-10-CM | POA: Insufficient documentation

## 2015-06-12 DIAGNOSIS — R079 Chest pain, unspecified: Secondary | ICD-10-CM

## 2015-06-12 LAB — CBC
HEMATOCRIT: 39.3 % (ref 39.0–52.0)
HEMOGLOBIN: 13.5 g/dL (ref 13.0–17.0)
MCH: 30.8 pg (ref 26.0–34.0)
MCHC: 34.4 g/dL (ref 30.0–36.0)
MCV: 89.7 fL (ref 78.0–100.0)
PLATELETS: 270 10*3/uL (ref 150–400)
RBC: 4.38 MIL/uL (ref 4.22–5.81)
RDW: 11.8 % (ref 11.5–15.5)
WBC: 5.3 10*3/uL (ref 4.0–10.5)

## 2015-06-12 LAB — CBG MONITORING, ED: Glucose-Capillary: 275 mg/dL — ABNORMAL HIGH (ref 65–99)

## 2015-06-12 LAB — BASIC METABOLIC PANEL
ANION GAP: 14 (ref 5–15)
BUN: 11 mg/dL (ref 6–20)
CALCIUM: 9.2 mg/dL (ref 8.9–10.3)
CO2: 22 mmol/L (ref 22–32)
Chloride: 92 mmol/L — ABNORMAL LOW (ref 101–111)
Creatinine, Ser: 0.78 mg/dL (ref 0.61–1.24)
GLUCOSE: 281 mg/dL — AB (ref 65–99)
POTASSIUM: 3.9 mmol/L (ref 3.5–5.1)
SODIUM: 128 mmol/L — AB (ref 135–145)

## 2015-06-12 LAB — I-STAT TROPONIN, ED: Troponin i, poc: 0 ng/mL (ref 0.00–0.08)

## 2015-06-12 MED ORDER — KETOROLAC TROMETHAMINE 30 MG/ML IJ SOLN
30.0000 mg | Freq: Once | INTRAMUSCULAR | Status: AC
  Administered 2015-06-12: 30 mg via INTRAVENOUS
  Filled 2015-06-12: qty 1

## 2015-06-12 MED ORDER — SODIUM CHLORIDE 0.9 % IV BOLUS (SEPSIS)
500.0000 mL | Freq: Once | INTRAVENOUS | Status: AC
  Administered 2015-06-12: 500 mL via INTRAVENOUS

## 2015-06-12 MED ORDER — GI COCKTAIL ~~LOC~~
30.0000 mL | Freq: Once | ORAL | Status: AC
  Administered 2015-06-12: 30 mL via ORAL
  Filled 2015-06-12: qty 30

## 2015-06-12 NOTE — ED Notes (Signed)
Declined W/C at D/C and was escorted to lobby by RN. 

## 2015-06-12 NOTE — ED Notes (Signed)
Pt states that he began to have left sided chest pain while watching tv last night and every time he tried to lay down the pain would get worse. No radiation. Pain is worse on palpation and with cough. Pt denies any recent illness. Pt denies sob, but admits to some lightheadedness. CP 6/10 intermittent.

## 2015-06-12 NOTE — Discharge Instructions (Signed)
Chest Pain Observation °It is often hard to give a specific diagnosis for the cause of chest pain. Among other possibilities your symptoms might be caused by inadequate oxygen delivery to your heart (angina). Angina that is not treated or evaluated can lead to a heart attack (myocardial infarction) or death. °Blood tests, electrocardiograms, and X-rays may have been done to help determine a possible cause of your chest pain. After evaluation and observation, your health care provider has determined that it is unlikely your pain was caused by an unstable condition that requires hospitalization. However, a full evaluation of your pain may need to be completed, with additional diagnostic testing as directed. It is very important to keep your follow-up appointments. Not keeping your follow-up appointments could result in permanent heart damage, disability, or death. If there is any problem keeping your follow-up appointments, you must call your health care provider. °HOME CARE INSTRUCTIONS  °Due to the slight chance that your pain could be angina, it is important to follow your health care provider's treatment plan and also maintain a healthy lifestyle: °· Maintain or work toward achieving a healthy weight. °· Stay physically active and exercise regularly. °· Decrease your salt intake. °· Eat a balanced, healthy diet. Talk to a dietitian to learn about heart-healthy foods. °· Increase your fiber intake by including whole grains, vegetables, fruits, and nuts in your diet. °· Avoid situations that cause stress, anger, or depression. °· Take medicines as advised by your health care provider. Report any side effects to your health care provider. Do not stop medicines or adjust the dosages on your own. °· Quit smoking. Do not use nicotine patches or gum until you check with your health care provider. °· Keep your blood pressure, blood sugar, and cholesterol levels within normal limits. °· Limit alcohol intake to no more than  1 drink per day for women who are not pregnant and 2 drinks per day for men. °· Do not abuse drugs. °SEEK IMMEDIATE MEDICAL CARE IF: °You have severe chest pain or pressure which may include symptoms such as: °· You feel pain or pressure in your arms, neck, jaw, or back. °· You have severe back or abdominal pain, feel sick to your stomach (nauseous), or throw up (vomit). °· You are sweating profusely. °· You are having a fast or irregular heartbeat. °· You feel short of breath while at rest. °· You notice increasing shortness of breath during rest, sleep, or with activity. °· You have chest pain that does not get better after rest or after taking your usual medicine. °· You wake from sleep with chest pain. °· You are unable to sleep because you cannot breathe. °· You develop a frequent cough or you are coughing up blood. °· You feel dizzy, faint, or experience extreme fatigue. °· You develop severe weakness, dizziness, fainting, or chills. °Any of these symptoms may represent a serious problem that is an emergency. Do not wait to see if the symptoms will go away. Call your local emergency services (911 in the [place] Do not drive yourself to the hospital. °MAKE SURE YOU: °· Understand these instructions. °· Will watch your condition. °· Will get help right away if you are not doing well or get worse. °  °This information is not intended to replace advice given to you by your health care provider. Make sure you discuss any questions you have with your health care provider. °  °Document Released: 07/29/2010 Document Revised: 07/01/2013 Document Reviewed: 12/26/2012 °[employer] Interactive Patient   Education 2016 [employer]  Chest Wall Pain Chest wall pain is pain in or around the bones and muscles of your chest. Sometimes, an injury causes this pain. Sometimes, the cause may not be known. This pain may take several weeks or longer to get better. HOME CARE INSTRUCTIONS  Pay attention to any changes in your  symptoms. Take these actions to help with your pain:   Rest as told by your health care provider.   Avoid activities that cause pain. These include any activities that use your chest muscles or your abdominal and side muscles to lift heavy items.   If directed, apply ice to the painful area:  Put ice in a plastic bag.  Place a towel between your skin and the bag.  Leave the ice on for 20 minutes, 2-3 times per day.  Take over-the-counter and prescription medicines only as told by your health care provider.  Do not use tobacco products, including cigarettes, chewing tobacco, and e-cigarettes. If you need help quitting, ask your health care provider.  Keep all follow-up visits as told by your health care provider. This is important. SEEK MEDICAL CARE IF:  You have a fever.  Your chest pain becomes worse.  You have new symptoms. SEEK IMMEDIATE MEDICAL CARE IF:  You have nausea or vomiting.  You feel sweaty or light-headed.  You have a cough with phlegm (sputum) or you cough up blood.  You develop shortness of breath.   This information is not intended to replace advice given to you by your health care provider. Make sure you discuss any questions you have with your health care provider.  Follow up with your primary care provider for re-evaluation. Avoid recreational drug use and encourage smoking cessation. Return to the Emergency Department if you experience worsening of your symptoms, difficulty breathing, loss of consciousness.

## 2015-06-12 NOTE — ED Provider Notes (Signed)
CSN: [id number]     Arrival date & time 06/12/15  0603 History   First MD Initiated Contact with Patient 06/12/15 [phone]     Chief Complaint  Patient presents with  . Chest Pain     (Consider location/radiation/quality/duration/timing/severity/associated sxs/prior Treatment) HPI  Robert Burton is a 29 y.o M with a pmhx of polysubstance abuse, DM, who presents to the ED c/o chest pain. Pt states that last night around 10 PM he began to have left-sided chest pain while at rest. Pain does not radiate. Pain is intermittent and lasts for 1 minute at a time. Pain is worsened with laying flat and with cough. Patient has not experienced this in the past. Patient reports frequent cocaine use 1-2 times per week, last use was 3 days ago. Denies shortness of breath, lower extremity swelling, dizziness, syncope, paresthesias, numbness, weakness. No history of DVT or PE. Patient smokes a half of cigarettes a pack a day.  Past Medical History  Diagnosis Date  . Diabetes mellitus   . Sleep apnea    Past Surgical History  Procedure Laterality Date  . Tonsillectomy     History reviewed. No pertinent family history. Social History  Substance Use Topics  . Smoking status: Current Every Day Smoker -- 0.50 packs/day    Types: Cigarettes  . Smokeless tobacco: None  . Alcohol Use: Yes     Comment: 2x/week    Review of Systems  All other systems reviewed and are negative.     Allergies  Other  Home Medications   Prior to Admission medications   Medication Sig Start Date End Date Taking? Authorizing Provider  insulin aspart (NOVOLOG) 100 UNIT/ML injection Inject 35 Units into the skin 3 (three) times daily before meals.   Yes Historical Provider, MD  metFORMIN (GLUCOPHAGE) 1000 MG tablet Take 1,000 mg by mouth 2 (two) times daily with a meal.   Yes Historical Provider, MD  diclofenac (VOLTAREN) 50 MG EC tablet 1 po bid pc x 7 days then if needed for chest pain Patient not taking: Reported on  06/12/2015 09/06/14   Riki SheerMichelle G Young, PA-C  indomethacin (INDOCIN) 25 MG capsule Take 1 capsule (25 mg total) by mouth 3 (three) times daily as needed. Patient not taking: Reported on 06/12/2015 10/06/14   Harle BattiestElizabeth Tysinger, NP  oxyCODONE-acetaminophen (PERCOCET/ROXICET) 5-325 MG per tablet Take 1 tablet by mouth every 6 (six) hours as needed for severe pain. Patient not taking: Reported on 06/12/2015 04/17/14   Charlestine Nighthristopher Lawyer, PA-C   BP 123/77 mmHg  Pulse 90  Temp(Src) 99.1 F (37.3 C) (Oral)  Resp 20  Ht 6\' 1"  (1.854 m)  Wt 108.863 kg  BMI 31.67 kg/m2  SpO2 97% Physical Exam  Constitutional: He is oriented to person, place, and time. He appears well-developed and well-nourished. No distress.  HENT:  Head: Normocephalic and atraumatic.  Mouth/Throat: Oropharynx is clear and moist. No oropharyngeal exudate.  Eyes: Conjunctivae and EOM are normal. Pupils are equal, round, and reactive to light. Right eye exhibits no discharge. Left eye exhibits no discharge. No scleral icterus.  Neck: Neck supple.  Cardiovascular: Normal rate, regular rhythm, normal heart sounds and intact distal pulses.  Exam reveals no gallop and no friction rub.   No murmur heard. Pulmonary/Chest: Effort normal and breath sounds normal. No respiratory distress. He has no wheezes. He has no rales. He exhibits no tenderness.  Abdominal: Soft. Bowel sounds are normal. He exhibits no distension. There is no tenderness. There is no guarding.  Musculoskeletal: Normal range of motion. He exhibits no edema.  Neurological: He is alert and oriented to person, place, and time. No cranial nerve deficit.  Strength 5/5 throughout. No sensory deficits.  No gait abnormality.  Skin: Skin is warm and dry. No rash noted. He is not diaphoretic. No erythema. No pallor.  Psychiatric: He has a normal mood and affect. His behavior is normal.  Nursing note and vitals reviewed.   ED Course  Procedures (including critical care  time) Labs Review Labs Reviewed  BASIC METABOLIC PANEL - Abnormal; Notable for the following:    Sodium 128 (*)    Chloride 92 (*)    Glucose, Bld 281 (*)    All other components within normal limits  CBG MONITORING, ED - Abnormal; Notable for the following:    Glucose-Capillary 275 (*)    All other components within normal limits  CBC  I-STAT TROPOININ, ED    Imaging Review Dg Chest 2 View  06/12/2015  CLINICAL DATA:  Chest tightness, worsening. EXAM: CHEST  2 VIEW COMPARISON:  Chest x-ray dated 09/06/2014. FINDINGS: Heart size is normal. Overall cardiomediastinal silhouette is normal in size and configuration. Lungs are clear. Lung volumes are normal. No pleural effusion seen. No pneumothorax. Slight elevation of the right hemidiaphragm is unchanged. Osseous and soft tissue structures about the chest are otherwise unremarkable. IMPRESSION: Stable chest x-ray. Lungs are clear and there is no evidence of acute cardiopulmonary abnormality. Electronically Signed   By: Bary Richard M.D.   On: 06/12/2015 07:27   I have personally reviewed and evaluated these images and lab results as part of my medical decision-making.   EKG Interpretation   Date/Time:  Saturday June 12 2015 06:10:06 EST Ventricular Rate:  106 PR Interval:  162 QRS Duration: 88 QT Interval:  324 QTC Calculation: 430 R Axis:   84 Text Interpretation:  Sinus tachycardia No old tracing to compare  Confirmed by CAMPOS  MD, Caryn Bee ([id number]) on 06/12/2015 7:37:27 AM      MDM   Final diagnoses:  Chest pain, unspecified chest pain type    Patient is to be discharged with recommendation to follow up with PCP in regards to today's hospital visit. Chest pain is not likely of cardiac or pulmonary etiology d/t presentation, perc negative, VSS, no tracheal deviation, no JVD or new murmur, RRR, breath sounds equal bilaterally, EKG without acute abnormalities, negative troponin, and negative CXR. Patient given Toradol and GI  cocktail in ED. Pain may possibly due to GERD. Pt has been advised start a PPI and return to the ED is CP becomes exertional, associated with diaphoresis or nausea, radiates to left jaw/arm, worsens or becomes concerning in any way. Discussed with patient consequences of using cocaine and encourage cessation as well as smoking cessation. Pt appears reliable for follow up and is agreeable to discharge.   Case has been discussed with and seen by Dr. Patria Mane who agrees with the above plan to discharge.      Lester Kinsman Anderson, PA-C 06/12/15 1240  Azalia Bilis, MD 06/12/15 [phone]

## 2016-03-20 IMAGING — DX DG CHEST 2V
2 series · 2 of 2 positions shown · non-contrast
Comparison: None.

CLINICAL DATA: Chest pain. Shortness of breath for 2 days. No
history of asthma. Nonsmoker.

EXAM:
CHEST  2 VIEW

[chest pa]
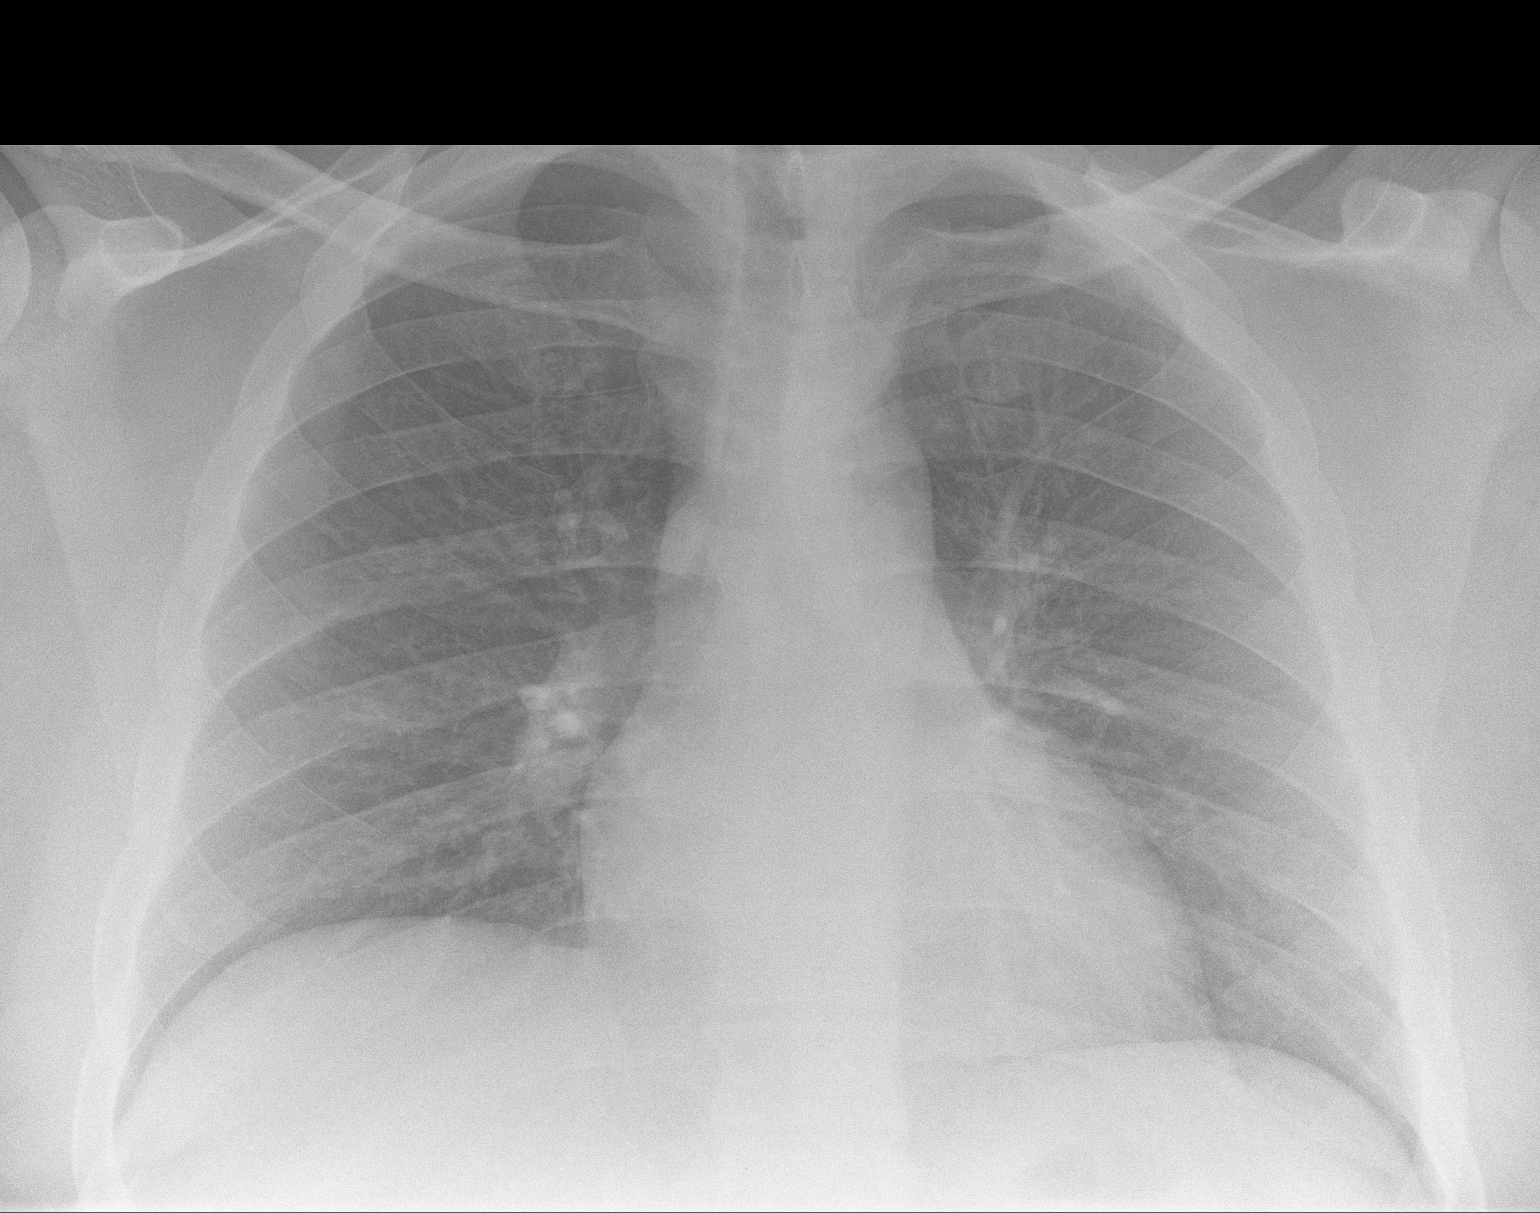

[chest lat]
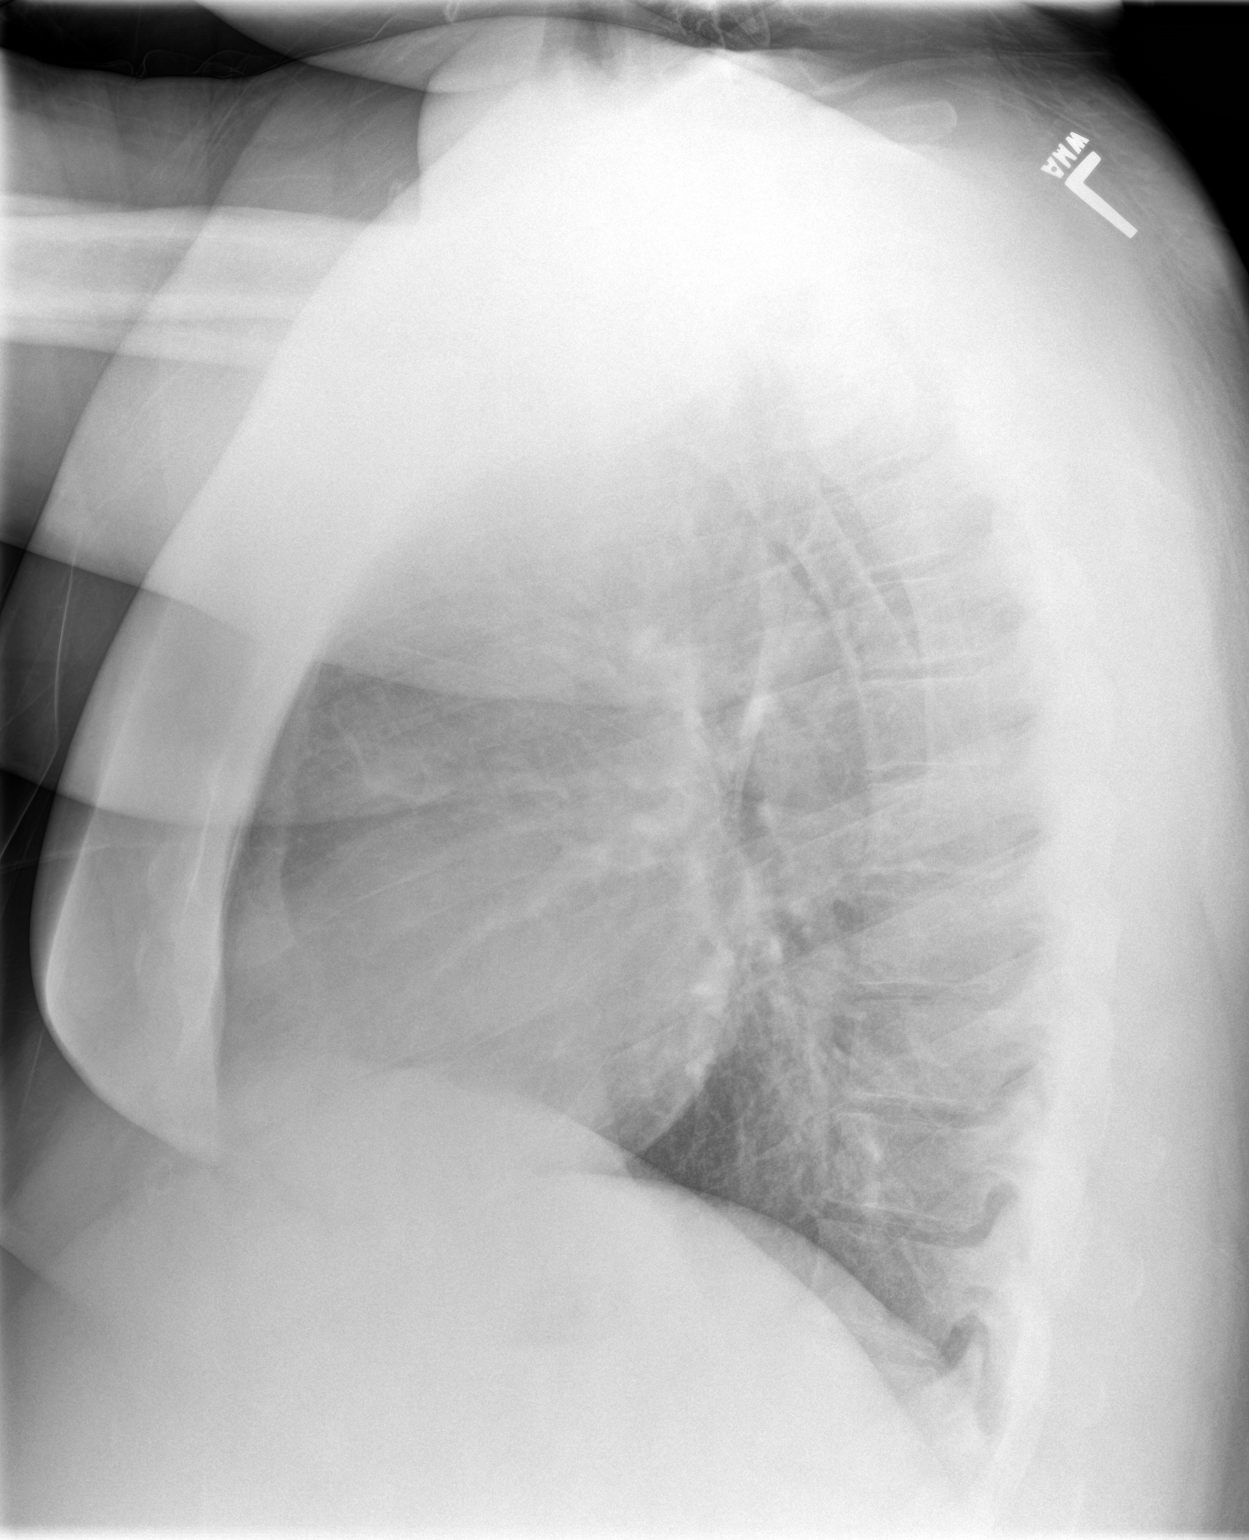

[2 of 2 positions shown; findings below may reference images not displayed]

FINDINGS: The heart size and mediastinal contours are within normal limits.
Both lungs are clear. The visualized skeletal structures are
unremarkable.
IMPRESSION: No active cardiopulmonary disease.

## 2017-06-13 ENCOUNTER — Encounter (HOSPITAL_COMMUNITY): Payer: Self-pay | Admitting: *Deleted

## 2017-06-13 ENCOUNTER — Ambulatory Visit (HOSPITAL_COMMUNITY)
Admission: EM | Admit: 2017-06-13 | Discharge: 2017-06-13 | Disposition: A | Discharge status: Home / Self Care | Payer: Self-pay | Attending: Family Medicine | Admitting: Family Medicine

## 2017-06-13 DIAGNOSIS — L738 Other specified follicular disorders: Secondary | ICD-10-CM

## 2017-06-13 MED ORDER — IBUPROFEN 800 MG PO TABS
800.0000 mg | ORAL_TABLET | Freq: Once | ORAL | Status: AC
  Administered 2017-06-13: 800 mg via ORAL

## 2017-06-13 MED ORDER — IBUPROFEN 800 MG PO TABS
ORAL_TABLET | ORAL | Status: AC
  Filled 2017-06-13: qty 1

## 2017-06-13 MED ORDER — CLINDAMYCIN HCL 300 MG PO CAPS: 300.0000 mg | ORAL_CAPSULE | Freq: Three times a day (TID) | ORAL | 0 refills | Status: DC

## 2017-06-13 NOTE — Discharge Instructions (Signed)
Do not shave until this clears up completely. Please follow up in a few days if you do not feel this is improving.

## 2017-06-13 NOTE — ED Triage Notes (Signed)
Per pt he has bump on his lower right and left jaw, per pt it hurts, per pt he noticed it yesterday.

## 2017-06-14 NOTE — ED Provider Notes (Signed)
  [facility]   [id number] 06/13/17 Arrival Time: 1258  ASSESSMENT & PLAN:  1. Folliculitis barbae     Meds ordered this encounter  Medications  . clindamycin (CLEOCIN) 300 MG capsule    Sig: Take 1 capsule (300 mg total) by mouth 3 (three) times daily.    Dispense:  30 capsule    Refill:  0  . ibuprofen (ADVIL,MOTRIN) tablet 800 mg   Will f/u if not improving over the next few days. Reviewed expectations re: course of current medical issues. Questions answered. Outlined signs and symptoms indicating need for more acute intervention. Patient verbalized understanding. After Visit Summary given.   SUBJECTIVE:  Robert Burton is a 31 y.o. male who presents with complaint of "bumps on my face, around my beard". Noticed yesterday. Bumps are tender. No drainage. Afebrile. No h/o similar. Shaved approx 2 days ago. No specific aggravating or alleviating factors reported. No OTC treatment.  ROS: As per HPI.   OBJECTIVE:  Vitals:   06/13/17 1342  BP: (!) 132/93  Pulse: 90  Temp: (!) 97.5 F (36.4 C)  TempSrc: Oral  SpO2: 100%    General appearance: alert; no distress Eyes: PERRLA; EOMI; conjunctiva normal HENT: normocephalic; atraumatic; TMs normal; nasal mucosa normal; oral mucosa normal Neck: supple Skin: warm and dry; several inflamed hair follicles consistent with folliculitis over beard area Psychological: alert and cooperative; normal mood and affect   Allergies  Allergen Reactions  . Other Other (See Comments)    GPD-6 disorder-penicillins and sulfa drugs along with others    Past Medical History:  Diagnosis Date  . Diabetes mellitus   . Sleep apnea    Social History   Socioeconomic History  . Marital status: Single    Spouse name: Not on file  . Number of children: Not on file  . Years of education: Not on file  . Highest education level: Not on file  Social Needs  . Financial resource strain: Not on file  . Food insecurity - worry: Not  on file  . Food insecurity - inability: Not on file  . Transportation needs - medical: Not on file  . Transportation needs - non-medical: Not on file  Occupational History  . Not on file  Tobacco Use  . Smoking status: Current Every Day Smoker    Packs/day: 0.50    Types: Cigarettes  . Smokeless tobacco: Never Used  Substance and Sexual Activity  . Alcohol use: Yes    Comment: 2x/week  . Drug use: No  . Sexual activity: Not on file  Other Topics Concern  . Not on file  Social History Narrative  . Not on file   Family History  Problem Relation Age of Onset  . Diabetes Mother   . Asthma Mother    Past Surgical History:  Procedure Laterality Date  . Greig RightNSILLECTOMY       Mixtli Reno, MD 06/14/17 [phone]

## 2017-12-07 ENCOUNTER — Ambulatory Visit: Payer: Self-pay | Admitting: Nurse Practitioner

## 2018-03-01 ENCOUNTER — Other Ambulatory Visit: Payer: Self-pay

## 2018-03-01 ENCOUNTER — Emergency Department (HOSPITAL_COMMUNITY)
Admission: EM | Admit: 2018-03-01 | Discharge: 2018-03-01 | Disposition: A | Discharge status: Home / Self Care | Payer: Medicaid Other | Attending: Emergency Medicine | Admitting: Emergency Medicine

## 2018-03-01 ENCOUNTER — Encounter (HOSPITAL_COMMUNITY): Payer: Self-pay | Admitting: Physician Assistant

## 2018-03-01 DIAGNOSIS — E119 Type 2 diabetes mellitus without complications: Secondary | ICD-10-CM | POA: Insufficient documentation

## 2018-03-01 DIAGNOSIS — Z794 Long term (current) use of insulin: Secondary | ICD-10-CM | POA: Insufficient documentation

## 2018-03-01 DIAGNOSIS — M545 Low back pain: Secondary | ICD-10-CM | POA: Insufficient documentation

## 2018-03-01 DIAGNOSIS — F17210 Nicotine dependence, cigarettes, uncomplicated: Secondary | ICD-10-CM | POA: Insufficient documentation

## 2018-03-01 DIAGNOSIS — R030 Elevated blood-pressure reading, without diagnosis of hypertension: Secondary | ICD-10-CM | POA: Insufficient documentation

## 2018-03-01 HISTORY — DX: Glucose-6-phosphate dehydrogenase (G6PD) deficiency without anemia: D75.A

## 2018-03-01 MED ORDER — NAPROXEN 250 MG PO TABS
500.0000 mg | ORAL_TABLET | Freq: Once | ORAL | Status: AC
  Administered 2018-03-01: 500 mg via ORAL
  Filled 2018-03-01: qty 2

## 2018-03-01 MED ORDER — METHOCARBAMOL 500 MG PO TABS
500.0000 mg | ORAL_TABLET | Freq: Once | ORAL | Status: AC
  Administered 2018-03-01: 500 mg via ORAL
  Filled 2018-03-01: qty 1

## 2018-03-01 MED ORDER — NAPROXEN 500 MG PO TABS: 500.0000 mg | ORAL_TABLET | Freq: Two times a day (BID) | ORAL | 0 refills | Status: DC

## 2018-03-01 MED ORDER — METHOCARBAMOL 500 MG PO TABS: 500.0000 mg | ORAL_TABLET | Freq: Two times a day (BID) | ORAL | 0 refills | Status: DC
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
## 2018-04-15 ENCOUNTER — Ambulatory Visit (HOSPITAL_COMMUNITY)
Admission: EM | Admit: 2018-04-15 | Discharge: 2018-04-15 | Disposition: A | Discharge status: Home / Self Care | Payer: Medicaid Other | Attending: Family Medicine | Admitting: Family Medicine

## 2018-04-15 ENCOUNTER — Other Ambulatory Visit: Payer: Self-pay

## 2018-04-15 ENCOUNTER — Encounter (HOSPITAL_COMMUNITY): Payer: Self-pay | Admitting: Emergency Medicine

## 2018-04-15 DIAGNOSIS — M545 Low back pain, unspecified: Secondary | ICD-10-CM

## 2018-04-15 DIAGNOSIS — G8929 Other chronic pain: Secondary | ICD-10-CM

## 2018-04-15 MED ORDER — METHYLPREDNISOLONE 4 MG PO TBPK: ORAL_TABLET | ORAL | 0 refills | Status: DC

## 2018-04-15 MED ORDER — KETOROLAC TROMETHAMINE 60 MG/2ML IM SOLN
60.0000 mg | Freq: Once | INTRAMUSCULAR | Status: AC
  Administered 2018-04-15: 60 mg via INTRAMUSCULAR

## 2018-04-15 MED ORDER — HYDROCODONE-ACETAMINOPHEN 7.5-325 MG PO TABS: 1.0000 | ORAL_TABLET | Freq: Four times a day (QID) | ORAL | 0 refills | Status: DC | PRN

## 2018-04-15 MED ORDER — KETOROLAC TROMETHAMINE 60 MG/2ML IM SOLN
INTRAMUSCULAR | Status: AC
  Filled 2018-04-15: qty 2

## 2018-04-15 MED ORDER — CYCLOBENZAPRINE HCL 10 MG PO TABS: 10.0000 mg | ORAL_TABLET | Freq: Two times a day (BID) | ORAL | 0 refills | Status: DC | PRN

## 2018-04-15 NOTE — ED Triage Notes (Signed)
Back pain for a month.  History of back injury from many years ago.  Certain movements cause pain in both legs.
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
## 2018-04-15 NOTE — Discharge Instructions (Addendum)
Rest Activity as tolerated Take the medrol pak as instructed This is a strong anti inflammatory medicine Take cyclobenzaprine as needed as a muscle relaxer.  This can cause drowsiness Take pain medicine when the pain is severe.  Do not drive on pain medicine.  This may also cause drowsiness. We will need to follow-up with a back specialist.  We do not do ongoing management of painful conditions. After the Medrol Dosepak is completed, you may go back to taking ibuprofen for pain

## 2018-06-11 ENCOUNTER — Other Ambulatory Visit: Payer: Self-pay

## 2018-06-11 ENCOUNTER — Encounter (HOSPITAL_COMMUNITY): Payer: Self-pay | Admitting: Emergency Medicine

## 2018-06-11 ENCOUNTER — Ambulatory Visit (HOSPITAL_COMMUNITY)
Admission: EM | Admit: 2018-06-11 | Discharge: 2018-06-11 | Discharge status: Against Medical Advice | Payer: Self-pay | Attending: Family Medicine | Admitting: Family Medicine
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
## 2018-06-11 NOTE — ED Notes (Signed)
Per AMy PA, pt refused to be examined because she would not prescribe him narcotics

## 2018-08-22 ENCOUNTER — Encounter (HOSPITAL_COMMUNITY): Payer: Self-pay | Admitting: Emergency Medicine

## 2018-08-22 ENCOUNTER — Ambulatory Visit (HOSPITAL_COMMUNITY)
Admission: EM | Admit: 2018-08-22 | Discharge: 2018-08-22 | Disposition: A | Discharge status: Home / Self Care | Payer: Medicaid Other | Attending: Family Medicine | Admitting: Family Medicine

## 2018-08-22 DIAGNOSIS — B9789 Other viral agents as the cause of diseases classified elsewhere: Secondary | ICD-10-CM

## 2018-08-22 DIAGNOSIS — J069 Acute upper respiratory infection, unspecified: Secondary | ICD-10-CM

## 2018-08-22 MED ORDER — GUAIFENESIN ER 600 MG PO TB12: 600.0000 mg | ORAL_TABLET | Freq: Two times a day (BID) | ORAL | 0 refills | Status: DC

## 2018-08-22 MED ORDER — GABAPENTIN 300 MG PO CAPS: 300.0000 mg | ORAL_CAPSULE | Freq: Every day | ORAL | 1 refills | Status: DC

## 2018-08-22 MED ORDER — IBUPROFEN 600 MG PO TABS: 600.0000 mg | ORAL_TABLET | Freq: Four times a day (QID) | ORAL | 0 refills | Status: DC | PRN

## 2018-08-22 NOTE — ED Triage Notes (Signed)
Pt c/o hot flashes x5 days, and states he has a cough, and every time he coughs his head hurts.

## 2018-08-22 NOTE — Discharge Instructions (Addendum)
I believe that you have a viral upper respiratory infection causing your cough, body aches, fever and chills. Mucinex for cough, congestion and thin mucus.  Ibuprofen for body aches and fever  I am starting you on gabapentin for diabetic neuropathy or nerve pain You can take 1 capsule at bedtime If you want to increase the dose you can take it twice a day once in the morning and once at bedtime I will give you contact for primary care  Follow up as needed for continued or worsening symptoms

## 2018-08-22 NOTE — ED Notes (Signed)
No answer-called by Hale Bogus, rad tech

## 2018-08-26 NOTE — ED Provider Notes (Signed)
[facility]    CSN: [id number] Arrival date & time: 08/22/18  1716     History   Chief Complaint No chief complaint on file.   HPI Robert Burton is a 33 y.o. male.   Pt also complaining of intermittent sharp pains and numbness in the feet over the past few months. He is a diabetic. No injuries or wounds to the feet.    Cough  Cough characteristics:  Dry and non-productive Sputum characteristics:  Yellow and clear Severity:  Moderate Duration:  5 days Timing:  Constant Progression:  Unchanged Chronicity:  New Smoker: yes   Context: smoke exposure and upper respiratory infection   Context: not animal exposure, not exposure to allergens, not fumes, not occupational exposure, not sick contacts, not weather changes and not with activity   Relieved by:  Nothing Worsened by:  Activity Ineffective treatments:  Cough suppressants Associated symptoms: chills   Associated symptoms: no chest pain, no diaphoresis, no ear fullness, no ear pain, no eye discharge, no fever, no headaches, no myalgias, no rash, no rhinorrhea, no shortness of breath, no sinus congestion, no sore throat, no weight loss and no wheezing     Past Medical History:  Diagnosis Date  . Diabetes mellitus   . G6PD deficiency   . Sleep apnea     There are no active problems to display for this patient.   Past Surgical History:  Procedure Laterality Date  . TONSILLECTOMY         Home Medications    Prior to Admission medications   Medication Sig Start Date End Date Taking? Authorizing Provider  gabapentin (NEURONTIN) 300 MG capsule Take 1 capsule (300 mg total) by mouth at bedtime. 08/22/18   Dahlia Byes A, NP  guaiFENesin (MUCINEX) 600 MG 12 hr tablet Take 1 tablet (600 mg total) by mouth 2 (two) times daily. 08/22/18   Dahlia Byes A, NP  ibuprofen (ADVIL,MOTRIN) 600 MG tablet Take 1 tablet (600 mg total) by mouth every 6 (six) hours as needed. 08/22/18   [employer], Gloris Manchester A, NP  insulin aspart  (NOVOLOG) 100 UNIT/ML injection Inject 35 Units into the skin 3 (three) times daily before meals.    [provider]  metFORMIN (GLUCOPHAGE) 1000 MG tablet Take 1,000 mg by mouth 2 (two) times daily with a meal.    [provider]  NON FORMULARY     [provider]    Family History Family History  Problem Relation Age of Onset  . Diabetes Mother   . Asthma Mother     Social History Social History   Tobacco Use  . Smoking status: Current Every Day Smoker    Packs/day: 0.50    Types: Cigarettes  . Smokeless tobacco: Never Used  Substance Use Topics  . Alcohol use: Yes    Comment: 2x/week  . Drug use: Yes    Types: Marijuana     Allergies   Other   Review of Systems Review of Systems  Constitutional: Positive for chills. Negative for diaphoresis, fever and weight loss.  HENT: Negative for ear pain, rhinorrhea and sore throat.   Eyes: Negative for discharge.  Respiratory: Positive for cough. Negative for shortness of breath and wheezing.   Cardiovascular: Negative for chest pain.  Musculoskeletal: Negative for myalgias.  Skin: Negative for rash.  Neurological: Negative for headaches.     Physical Exam Triage Vital Signs ED Triage Vitals [08/22/18 1754]  Enc Vitals Group     BP 127/74  Pulse Rate (!) 118     Resp 18     Temp 97.9 F (36.6 C)     Temp src      SpO2 97 %     Weight      Height      Head Circumference      Peak Flow      Pain Score 9     Pain Loc      Pain Edu?      Excl. in GC?    No data found.  Updated Vital Signs BP 127/74   Pulse (!) 118   Temp 97.9 F (36.6 C)   Resp 18   SpO2 97%   Visual Acuity Right Eye Distance:   Left Eye Distance:   Bilateral Distance:    Right Eye Near:   Left Eye Near:    Bilateral Near:     Physical Exam Vitals signs and nursing note reviewed.  Constitutional:      General: He is not in acute distress.    Appearance: He is well-developed. He is not  ill-appearing, toxic-appearing or diaphoretic.  HENT:     Head: Normocephalic and atraumatic.     Nose: Nose normal.     Mouth/Throat:     Pharynx: Oropharynx is clear.  Eyes:     Conjunctiva/sclera: Conjunctivae normal.  Neck:     Musculoskeletal: Neck supple.  Cardiovascular:     Rate and Rhythm: Normal rate and regular rhythm.     Heart sounds: No murmur.  Pulmonary:     Effort: Pulmonary effort is normal. No respiratory distress.     Breath sounds: Normal breath sounds.  Abdominal:     Palpations: Abdomen is soft.     Tenderness: There is no abdominal tenderness.  Musculoskeletal: Normal range of motion.  Skin:    General: Skin is warm and dry.  Neurological:     Mental Status: He is alert.  Psychiatric:        Mood and Affect: Mood normal.      UC Treatments / Results  Labs (all labs ordered are listed, but only abnormal results are displayed) Labs Reviewed - No data to display  EKG None  Radiology No results found.  Procedures Procedures (including critical care time)  Medications Ordered in UC Medications - No data to display  Initial Impression / Assessment and Plan / UC Course  I have reviewed the triage vital signs and the nursing notes.  Pertinent labs & imaging results that were available during my care of the patient were reviewed by me and considered in my medical decision making (see chart for details).     Viral URI- will treat with mucinex for cough and congestion and ibuprofen for fever, chills, and pain.   Diabetic neuropathy- will start pt on gabapentin at bedtime and have him follow up with PCP.  He reports that he does not currently have a PCP.  Contacts given on discharge instructions for PCP and neurosurgeon for chronic back  pain.  Final Clinical Impressions(s) / UC Diagnoses   Final diagnoses:  Viral URI with cough     Discharge Instructions     I believe that you have a viral upper respiratory infection causing your cough,  body aches, fever and chills. Mucinex for cough, congestion and thin mucus.  Ibuprofen for body aches and fever  I am starting you on gabapentin for diabetic neuropathy or nerve pain You can take 1 capsule at bedtime If you  want to increase the dose you can take it twice a day once in the morning and once at bedtime I will give you contact for primary care  Follow up as needed for continued or worsening symptoms     ED Prescriptions    Medication Sig Dispense Auth. Provider   guaiFENesin (MUCINEX) 600 MG 12 hr tablet Take 1 tablet (600 mg total) by mouth 2 (two) times daily. 14 tablet Rosbel Buckner A, NP   ibuprofen (ADVIL,MOTRIN) 600 MG tablet Take 1 tablet (600 mg total) by mouth every 6 (six) hours as needed. 30 tablet Daily Crate A, NP   gabapentin (NEURONTIN) 300 MG capsule Take 1 capsule (300 mg total) by mouth at bedtime. 30 capsule Dahlia ByesBast, Shantrice Rodenberg A, NP     Controlled Substance Prescriptions [place] Controlled Substance Registry consulted? Not Applicable   Janace ArisBast, Soriah Leeman A, NP 08/26/18 [phone]

## 2018-12-20 ENCOUNTER — Ambulatory Visit (HOSPITAL_COMMUNITY)
Admission: EM | Admit: 2018-12-20 | Discharge: 2018-12-20 | Disposition: A | Discharge status: Home / Self Care | Payer: Medicaid Other | Attending: Family Medicine | Admitting: Family Medicine

## 2018-12-20 ENCOUNTER — Encounter (HOSPITAL_COMMUNITY): Payer: Self-pay

## 2018-12-20 ENCOUNTER — Other Ambulatory Visit: Payer: Self-pay

## 2018-12-20 DIAGNOSIS — M7918 Myalgia, other site: Secondary | ICD-10-CM

## 2018-12-20 DIAGNOSIS — M545 Low back pain, unspecified: Secondary | ICD-10-CM

## 2018-12-20 MED ORDER — CYCLOBENZAPRINE HCL 10 MG PO TABS: 10.0000 mg | ORAL_TABLET | Freq: Two times a day (BID) | ORAL | 0 refills | Status: DC | PRN

## 2018-12-20 MED ORDER — HYDROCODONE-ACETAMINOPHEN 7.5-325 MG PO TABS: 1.0000 | ORAL_TABLET | Freq: Four times a day (QID) | ORAL | 0 refills | Status: DC | PRN

## 2018-12-20 MED ORDER — NAPROXEN 500 MG PO TABS: 500.0000 mg | ORAL_TABLET | Freq: Two times a day (BID) | ORAL | 0 refills | Status: DC
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
## 2018-12-20 NOTE — Discharge Instructions (Addendum)
Take Naprosyn 2 times a day with food.  This is an anti-inflammatory pain medicine. Take Flexeril as needed as a muscle relaxer. Take hydrocodone as needed for severe pain.  Do not drive on hydrocodone.  This can make you dizzy or drowsy. Ice or heat to areas. Activity as tolerated

## 2020-06-13 ENCOUNTER — Other Ambulatory Visit: Payer: Self-pay

## 2020-06-13 ENCOUNTER — Ambulatory Visit (HOSPITAL_COMMUNITY)
Admission: EM | Admit: 2020-06-13 | Discharge: 2020-06-13 | Disposition: A | Discharge status: Home / Self Care | Payer: Self-pay | Attending: Internal Medicine | Admitting: Internal Medicine

## 2020-06-13 ENCOUNTER — Ambulatory Visit (INDEPENDENT_AMBULATORY_CARE_PROVIDER_SITE_OTHER): Payer: Self-pay

## 2020-06-13 ENCOUNTER — Encounter (HOSPITAL_COMMUNITY): Payer: Self-pay | Admitting: Emergency Medicine

## 2020-06-13 ENCOUNTER — Ambulatory Visit (HOSPITAL_COMMUNITY): Payer: Self-pay

## 2020-06-13 DIAGNOSIS — F17210 Nicotine dependence, cigarettes, uncomplicated: Secondary | ICD-10-CM | POA: Insufficient documentation

## 2020-06-13 DIAGNOSIS — R519 Headache, unspecified: Secondary | ICD-10-CM | POA: Insufficient documentation

## 2020-06-13 DIAGNOSIS — R079 Chest pain, unspecified: Secondary | ICD-10-CM

## 2020-06-13 DIAGNOSIS — R059 Cough, unspecified: Secondary | ICD-10-CM

## 2020-06-13 DIAGNOSIS — Z20822 Contact with and (suspected) exposure to covid-19: Secondary | ICD-10-CM | POA: Insufficient documentation

## 2020-06-13 DIAGNOSIS — J069 Acute upper respiratory infection, unspecified: Secondary | ICD-10-CM | POA: Insufficient documentation

## 2020-06-13 LAB — CBG MONITORING, ED: Glucose-Capillary: 252 mg/dL — ABNORMAL HIGH (ref 70–99)

## 2020-06-13 LAB — RESP PANEL BY RT-PCR (FLU A&B, COVID) ARPGX2
Influenza A by PCR: NEGATIVE
Influenza B by PCR: NEGATIVE
SARS Coronavirus 2 by RT PCR: NEGATIVE

## 2020-06-13 MED ORDER — ALBUTEROL SULFATE HFA 108 (90 BASE) MCG/ACT IN AERS: 1.0000 | INHALATION_SPRAY | Freq: Four times a day (QID) | RESPIRATORY_TRACT | 0 refills | Status: AC | PRN

## 2020-06-13 MED ORDER — KETOROLAC TROMETHAMINE 30 MG/ML IJ SOLN
30.0000 mg | Freq: Once | INTRAMUSCULAR | Status: AC
  Administered 2020-06-13: 30 mg via INTRAMUSCULAR

## 2020-06-13 MED ORDER — BENZONATATE 100 MG PO CAPS: 100.0000 mg | ORAL_CAPSULE | Freq: Three times a day (TID) | ORAL | 0 refills | Status: DC | PRN

## 2020-06-13 MED ORDER — KETOROLAC TROMETHAMINE 30 MG/ML IJ SOLN
INTRAMUSCULAR | Status: AC
  Filled 2020-06-13: qty 1

## 2020-06-13 NOTE — ED Provider Notes (Signed)
[facility]    CSN: [id number] Arrival date & time: 06/13/20  1126      History   Chief Complaint Chief Complaint  Patient presents with  . Headache  . Chest Pain  . Cough    HPI Robert Burton is a 34 y.o. male.   Robert Burton presents with complaints of  Cough, congestion and headache which started around 3 days ago. Maybe a fever initially which has since resolved. No gi symptoms. No shortness of breath . No history of asthma but does smoke. He is diabetic, states sometimes he gets a headache if his blood sugar is elevated , but it has not been elevated when he has checked it. Does feel chest pain when he coughs and sometimes some chest pressure. No known ill contacts. No history of covid-19 and has not received vaccination. Has taken allergy medications for his congestion but otherwise no other medications for symptoms.    ROS per HPI, negative if not otherwise mentioned.      Past Medical History:  Diagnosis Date  . Diabetes mellitus   . G6PD deficiency   . Sleep apnea     There are no problems to display for this patient.   Past Surgical History:  Procedure Laterality Date  . TONSILLECTOMY         Home Medications    Prior to Admission medications   Medication Sig Start Date End Date Taking? Authorizing Provider  albuterol (PROAIR HFA) 108 (90 Base) MCG/ACT inhaler Inhale 1-2 puffs into the lungs every 6 (six) hours as needed for wheezing or shortness of breath. 06/13/20   Georgetta Haber, NP  benzonatate (TESSALON) 100 MG capsule Take 1-2 capsules (100-200 mg total) by mouth 3 (three) times daily as needed for cough. 06/13/20   Georgetta Haber, NP  cyclobenzaprine (FLEXERIL) 10 MG tablet Take 1 tablet (10 mg total) by mouth 2 (two) times daily as needed for muscle spasms. 12/20/18   Eustace Moore, MD  gabapentin (NEURONTIN) 300 MG capsule Take 1 capsule (300 mg total) by mouth at bedtime. 08/22/18   Dahlia Byes A, NP  guaiFENesin  (MUCINEX) 600 MG 12 hr tablet Take 1 tablet (600 mg total) by mouth 2 (two) times daily. 08/22/18   Dahlia Byes A, NP  HYDROcodone-acetaminophen (NORCO) 7.5-325 MG tablet Take 1 tablet by mouth every 6 (six) hours as needed for moderate pain. 12/20/18   Eustace Moore, MD  ibuprofen (ADVIL,MOTRIN) 600 MG tablet Take 1 tablet (600 mg total) by mouth every 6 (six) hours as needed. 08/22/18   Bast, Gloris [place] A, NP  insulin aspart (NOVOLOG) 100 UNIT/ML injection Inject 35 Units into the skin 3 (three) times daily before meals.    [provider]  metFORMIN (GLUCOPHAGE) 1000 MG tablet Take 1,000 mg by mouth 2 (two) times daily with a meal.    [provider]  naproxen (NAPROSYN) 500 MG tablet Take 1 tablet (500 mg total) by mouth 2 (two) times daily. 12/20/18   Eustace Moore, MD  NON FORMULARY     [provider]    Family History Family History  Problem Relation Age of Onset  . Diabetes Mother   . Asthma Mother     Social History Social History   Tobacco Use  . Smoking status: Current Every Day Smoker    Packs/day: 0.25    Types: Cigarettes  . Smokeless tobacco: Never Used  Vaping Use  . Vaping Use: Never used  Substance  Use Topics  . Alcohol use: Yes    Comment: 2x/week  . Drug use: Yes    Types: Marijuana     Allergies   Other   Review of Systems Review of Systems   Physical Exam Triage Vital Signs ED Triage Vitals  Enc Vitals Group     BP 06/13/20 1131 (!) 131/92     Pulse Rate 06/13/20 1131 95     Resp 06/13/20 1131 18     Temp 06/13/20 1131 97.9 F (36.6 C)     Temp Source 06/13/20 1131 Oral     SpO2 06/13/20 1131 97 %     Weight --      Height --      Head Circumference --      Peak Flow --      Pain Score 06/13/20 1129 4     Pain Loc --      Pain Edu? --      Excl. in GC? --    No data found.  Updated Vital Signs BP (!) 131/92 (BP Location: Right Arm)   Pulse 95   Temp 97.9 F (36.6 C) (Oral)   Resp 18   SpO2 97%    Visual Acuity Right Eye Distance:   Left Eye Distance:   Bilateral Distance:    Right Eye Near:   Left Eye Near:    Bilateral Near:     Physical Exam Constitutional:      General: He is not in acute distress.    Appearance: He is well-developed. He is not toxic-appearing.  HENT:     Nose: Congestion present.     Mouth/Throat:     Mouth: Mucous membranes are moist.  Cardiovascular:     Rate and Rhythm: Normal rate and regular rhythm.  Pulmonary:     Effort: Pulmonary effort is normal.     Breath sounds: Normal breath sounds.  Skin:    General: Skin is warm and dry.  Neurological:     Mental Status: He is alert and oriented to person, place, and time.      UC Treatments / Results  Labs (all labs ordered are listed, but only abnormal results are displayed) Labs Reviewed  CBG MONITORING, ED - Abnormal; Notable for the following components:      Result Value   Glucose-Capillary 252 (*)    All other components within normal limits  RESP PANEL BY RT-PCR (FLU A&B, COVID) [id number]    EKG   Radiology DG Chest 2 View  Result Date: 06/13/2020 CLINICAL DATA:  Chest pain and cough EXAM: CHEST - 2 VIEW COMPARISON:  06/12/2015 FINDINGS: The heart size and mediastinal contours are within normal limits. Both lungs are clear. The visualized skeletal structures are unremarkable. IMPRESSION: No active cardiopulmonary disease. Electronically Signed   By: Alcide Clever M.D.   On: 06/13/2020 12:18    Procedures Procedures (including critical care time)  Medications Ordered in UC Medications  ketorolac (TORADOL) 30 MG/ML injection 30 mg (30 mg Intramuscular Given 06/13/20 1301)    Initial Impression / Assessment and Plan / UC Course  I have reviewed the triage vital signs and the nursing notes.  Pertinent labs & imaging results that were available during my care of the patient were reviewed by me and considered in my medical decision making (see chart for details).     Non  toxic, chest xray normal here today. History and physical consistent with viral illness.  Covid testing pending and isolation instructions  provided.  toradol provided to help with headache. Supportive cares recommended. Return precautions provided. Patient verbalized understanding and agreeable to plan.   Final Clinical Impressions(s) / UC Diagnoses   Final diagnoses:  Upper respiratory tract infection, unspecified type  Bad headache     Discharge Instructions     Push fluids to ensure adequate hydration and keep secretions thin.  Tylenol and/or ibuprofen as needed for pain or fevers.   Use of inhaler as needed for wheezing or shortness of breath.   Decrease to quit smoking.  Tessalon capsule as needed for cough.  Self isolate until covid results are back and negative.  Will notify you by phone of any positive findings. Your negative results will be sent through your MyChart.     If symptoms worsen or do not improve in the next week to return to be seen or to follow up with your PCP.      ED Prescriptions    Medication Sig Dispense Auth. Provider   albuterol (PROAIR HFA) 108 (90 Base) MCG/ACT inhaler Inhale 1-2 puffs into the lungs every 6 (six) hours as needed for wheezing or shortness of breath. 1 each Georgetta Haber, NP   benzonatate (TESSALON) 100 MG capsule Take 1-2 capsules (100-200 mg total) by mouth 3 (three) times daily as needed for cough. 21 capsule Georgetta Haber, NP     PDMP not reviewed this encounter.   Georgetta Haber, NP 06/13/20 1442

## 2020-06-13 NOTE — Discharge Instructions (Signed)
Push fluids to ensure adequate hydration and keep secretions thin.  Tylenol and/or ibuprofen as needed for pain or fevers.   Use of inhaler as needed for wheezing or shortness of breath.   Decrease to quit smoking.  Tessalon capsule as needed for cough.  Self isolate until covid results are back and negative.  Will notify you by phone of any positive findings. Your negative results will be sent through your MyChart.     If symptoms worsen or do not improve in the next week to return to be seen or to follow up with your PCP.

## 2020-06-13 NOTE — ED Triage Notes (Addendum)
Pt presents with severe headache, chest pressure, and cough xs 3-4 days. States chest pain is worse with cough.   Okay per Marilynn Rail, NP for pt to be seen at [facility].

## 2020-10-06 ENCOUNTER — Encounter (HOSPITAL_COMMUNITY): Payer: Self-pay | Admitting: Emergency Medicine

## 2020-10-06 ENCOUNTER — Other Ambulatory Visit: Payer: Self-pay

## 2020-10-06 ENCOUNTER — Telehealth (HOSPITAL_COMMUNITY): Payer: Self-pay | Admitting: Medical Oncology

## 2020-10-06 ENCOUNTER — Ambulatory Visit (HOSPITAL_COMMUNITY)
Admission: EM | Admit: 2020-10-06 | Discharge: 2020-10-06 | Disposition: A | Discharge status: Home / Self Care | Payer: Self-pay | Attending: Medical Oncology | Admitting: Medical Oncology

## 2020-10-06 DIAGNOSIS — R059 Cough, unspecified: Secondary | ICD-10-CM | POA: Insufficient documentation

## 2020-10-06 DIAGNOSIS — M545 Low back pain, unspecified: Secondary | ICD-10-CM | POA: Insufficient documentation

## 2020-10-06 LAB — POCT URINALYSIS DIPSTICK, ED / UC
Bilirubin Urine: NEGATIVE
Glucose, UA: 100 mg/dL — AB
Hgb urine dipstick: NEGATIVE
Leukocytes,Ua: NEGATIVE
Nitrite: NEGATIVE
Protein, ur: NEGATIVE mg/dL
Specific Gravity, Urine: 1.02 (ref 1.005–1.030)
Urobilinogen, UA: 0.2 mg/dL (ref 0.0–1.0)
pH: 5.5 (ref 5.0–8.0)

## 2020-10-06 LAB — BASIC METABOLIC PANEL
Anion gap: 12 (ref 5–15)
BUN: 11 mg/dL (ref 6–20)
CO2: 29 mmol/L (ref 22–32)
Calcium: 9.5 mg/dL (ref 8.9–10.3)
Chloride: 91 mmol/L — ABNORMAL LOW (ref 98–111)
Creatinine, Ser: 0.9 mg/dL (ref 0.61–1.24)
GFR, Estimated: >60 mL/min (ref >60)
Glucose, Bld: 209 mg/dL — ABNORMAL HIGH (ref 70–99)
Potassium: 3.6 mmol/L (ref 3.5–5.1)
Sodium: 132 mmol/L — ABNORMAL LOW (ref 135–145)

## 2020-10-06 LAB — CBG MONITORING, ED: Glucose-Capillary: 197 mg/dL — ABNORMAL HIGH (ref 70–99)

## 2020-10-06 MED ORDER — IBUPROFEN 800 MG PO TABS: 800.0000 mg | ORAL_TABLET | Freq: Three times a day (TID) | ORAL | 0 refills | Status: DC

## 2020-10-06 NOTE — ED Provider Notes (Signed)
[facility]    CSN: [id number] Arrival date & time: 10/06/20  1700      History   Chief Complaint Chief Complaint  Patient presents with  . Back Pain    HPI Robert Burton is a 35 y.o. male.   HPI  Back Pain: Pt reports mid and low back pain for the past week. He has also had some associated increased urinary frequency but also notes that he has uncontrolled DM2 and that his sugars have been running "high" at home. Mild occasional dysuria. No fevers, vomiting, abdominal pain. No saddle anesthesia, incontinence or weakness noted. He also notes that he has had a dry cough and nasal congestion for the past 2 days. No SOB, wheezing, chest pains, fevers. He has not tried anything for symptoms. He declines testing or further evaluation for cough.    Past Medical History:  Diagnosis Date  . Diabetes mellitus   . G6PD deficiency   . Sleep apnea     There are no problems to display for this patient.   Past Surgical History:  Procedure Laterality Date  . TONSILLECTOMY         Home Medications    Prior to Admission medications   Medication Sig Start Date End Date Taking? Authorizing Provider  albuterol (PROAIR HFA) 108 (90 Base) MCG/ACT inhaler Inhale 1-2 puffs into the lungs every 6 (six) hours as needed for wheezing or shortness of breath. 06/13/20   Georgetta Haber, NP  cyclobenzaprine (FLEXERIL) 10 MG tablet Take 1 tablet (10 mg total) by mouth 2 (two) times daily as needed for muscle spasms. 12/20/18   Eustace Moore, MD  gabapentin (NEURONTIN) 300 MG capsule Take 1 capsule (300 mg total) by mouth at bedtime. 08/22/18   Dahlia Byes A, NP  guaiFENesin (MUCINEX) 600 MG 12 hr tablet Take 1 tablet (600 mg total) by mouth 2 (two) times daily. 08/22/18   Dahlia Byes A, NP  HYDROcodone-acetaminophen (NORCO) 7.5-325 MG tablet Take 1 tablet by mouth every 6 (six) hours as needed for moderate pain. 12/20/18   Eustace Moore, MD  ibuprofen (ADVIL,MOTRIN) 600 MG  tablet Take 1 tablet (600 mg total) by mouth every 6 (six) hours as needed. 08/22/18   Bast, Gloris [place] A, NP  insulin aspart (NOVOLOG) 100 UNIT/ML injection Inject 35 Units into the skin 3 (three) times daily before meals.    [provider]  metFORMIN (GLUCOPHAGE) 1000 MG tablet Take 1,000 mg by mouth 2 (two) times daily with a meal.    [provider]  naproxen (NAPROSYN) 500 MG tablet Take 1 tablet (500 mg total) by mouth 2 (two) times daily. 12/20/18   Eustace Moore, MD  NON FORMULARY     [provider]    Family History Family History  Problem Relation Age of Onset  . Diabetes Mother   . Asthma Mother     Social History Social History   Tobacco Use  . Smoking status: Current Every Day Smoker    Packs/day: 0.25    Types: Cigarettes  . Smokeless tobacco: Never Used  Vaping Use  . Vaping Use: Never used  Substance Use Topics  . Alcohol use: Yes    Comment: 2x/week  . Drug use: Yes    Types: Marijuana     Allergies   Other   Review of Systems Review of Systems  As stated above in HPI Physical Exam Triage Vital Signs ED Triage Vitals [10/06/20 0906]  Enc Vitals Group  BP (!) 167/85     Pulse Rate (!) 121     Resp 20     Temp 98.5 F (36.9 C)     Temp src      SpO2 97 %     Weight      Height      Head Circumference      Peak Flow      Pain Score 9     Pain Loc      Pain Edu?      Excl. in GC?    No data found.  Updated Vital Signs BP (!) 167/85 (BP Location: Left Arm)   Pulse (!) 121   Temp 98.5 F (36.9 C)   Resp 20   SpO2 97%   Physical Exam Vitals and nursing note reviewed.  Constitutional:      General: He is not in acute distress.    Appearance: Normal appearance. He is not ill-appearing, toxic-appearing or diaphoretic.  HENT:     Head: Normocephalic and atraumatic.     Right Ear: Tympanic membrane, ear canal and external ear normal.     Left Ear: Tympanic membrane, ear canal and external ear normal.      Nose: Congestion and rhinorrhea (clear thin) present.     Mouth/Throat:     Mouth: Mucous membranes are moist.     Pharynx: No oropharyngeal exudate or posterior oropharyngeal erythema.  Eyes:     Extraocular Movements: Extraocular movements intact.     Pupils: Pupils are equal, round, and reactive to light.  Cardiovascular:     Rate and Rhythm: Normal rate and regular rhythm.     Heart sounds: Normal heart sounds.  Pulmonary:     Effort: Pulmonary effort is normal. No respiratory distress.     Breath sounds: Normal breath sounds. No stridor. No wheezing, rhonchi or rales.  Chest:     Chest wall: No tenderness.  Abdominal:     General: Bowel sounds are normal.     Palpations: Abdomen is soft.     Tenderness: There is no right CVA tenderness or left CVA tenderness.  Musculoskeletal:        General: No swelling or tenderness. Normal range of motion.     Cervical back: Normal range of motion and neck supple.  Lymphadenopathy:     Cervical: No cervical adenopathy.  Skin:    Capillary Refill: Capillary refill takes less than 2 seconds.  Neurological:     Mental Status: He is alert and oriented to person, place, and time.  Psychiatric:        Behavior: Behavior normal.      UC Treatments / Results  Labs (all labs ordered are listed, but only abnormal results are displayed) Labs Reviewed  CBG MONITORING, ED - Abnormal; Notable for the following components:      Result Value   Glucose-Capillary 197 (*)    All other components within normal limits  POCT URINALYSIS DIPSTICK, ED / UC    EKG   Radiology No results found.  Procedures Procedures (including critical care time)  Medications Ordered in UC Medications - No data to display  Initial Impression / Assessment and Plan / UC Course  I have reviewed the triage vital signs and the nursing notes.  Pertinent labs & imaging results that were available during my care of the patient were reviewed by me and considered in  my medical decision making (see chart for details).     New. May have  had a small kidney stone which appears to have passed.Treating with ibuprofen after lab verification. POC glucose is stable. Discussed red flag signs and symptoms.  Final Clinical Impressions(s) / UC Diagnoses   Final diagnoses:  None   Discharge Instructions   None    ED Prescriptions    None     PDMP not reviewed this encounter.   Rushie Chestnut, [place] 10/06/20 1925

## 2020-10-06 NOTE — ED Triage Notes (Signed)
Patient presents to [facility] for evaluation of "kidney pain" and lower back pain.  Patient states he is diabetic, does not see a PCP regularly.  States his sugars have been "high" at home.  Patient c/o bilateral foot numbness x 1 month.  C/o increased urination "sometimes".

## 2020-10-07 LAB — URINE CULTURE: Culture: NO GROWTH

## 2020-10-08 NOTE — Telephone Encounter (Signed)
RX sent

## 2020-10-15 ENCOUNTER — Ambulatory Visit: Payer: Self-pay | Admitting: Student

## 2020-10-15 ENCOUNTER — Other Ambulatory Visit: Payer: Self-pay

## 2020-10-15 ENCOUNTER — Encounter: Payer: Self-pay | Admitting: Student

## 2020-10-15 VITALS — BP 143/86 | HR 98 | Temp 97.5°F | Ht 73.0 in | Wt 263.7 lb

## 2020-10-15 DIAGNOSIS — F32A Depression, unspecified: Secondary | ICD-10-CM | POA: Insufficient documentation

## 2020-10-15 DIAGNOSIS — E119 Type 2 diabetes mellitus without complications: Secondary | ICD-10-CM

## 2020-10-15 DIAGNOSIS — K219 Gastro-esophageal reflux disease without esophagitis: Secondary | ICD-10-CM

## 2020-10-15 DIAGNOSIS — Z794 Long term (current) use of insulin: Secondary | ICD-10-CM

## 2020-10-15 DIAGNOSIS — J309 Allergic rhinitis, unspecified: Secondary | ICD-10-CM

## 2020-10-15 DIAGNOSIS — G8929 Other chronic pain: Secondary | ICD-10-CM

## 2020-10-15 DIAGNOSIS — M545 Low back pain, unspecified: Secondary | ICD-10-CM

## 2020-10-15 DIAGNOSIS — D75A Glucose-6-phosphate dehydrogenase (G6PD) deficiency without anemia: Secondary | ICD-10-CM

## 2020-10-15 DIAGNOSIS — F321 Major depressive disorder, single episode, moderate: Secondary | ICD-10-CM

## 2020-10-15 LAB — POCT GLYCOSYLATED HEMOGLOBIN (HGB A1C): Hemoglobin A1C: 10.2 % — AB (ref 4.0–5.6)

## 2020-10-15 LAB — GLUCOSE, CAPILLARY: Glucose-Capillary: 308 mg/dL — ABNORMAL HIGH (ref 70–99)

## 2020-10-15 MED ORDER — METFORMIN HCL 500 MG PO TABS: ORAL_TABLET | ORAL | 1 refills | Status: AC

## 2020-10-15 MED ORDER — INSULIN GLARGINE 100 UNIT/ML ~~LOC~~ SOLN: 30.0000 [IU] | Freq: Every day | SUBCUTANEOUS | 2 refills | Status: DC

## 2020-10-15 MED ORDER — INSULIN ASPART 100 UNIT/ML ~~LOC~~ SOLN: 15.0000 [IU] | Freq: Three times a day (TID) | SUBCUTANEOUS | 2 refills | Status: DC

## 2020-10-15 MED ORDER — GABAPENTIN 300 MG PO CAPS: 300.0000 mg | ORAL_CAPSULE | Freq: Two times a day (BID) | ORAL | 1 refills | Status: DC

## 2020-10-15 NOTE — Patient Instructions (Addendum)
Thank you, Robert Burton for allowing [place] to provide your care today. Today we discussed your diabetes, back pain, nasal congestion, acid reflux and your neuropathy.   1).  Diabetes: we want you to take your insulin daily.  We are increasing your Lantus to 30 units at bedtime.  Make sure to check your fasting blood sugar each morning and make a log of your blood sugar readings. 2) Low back pain: Continue taking the Flexeril as needed and add Tylenol up to 1000 mg daily for pain. Try heat and cold compressions on your back. Follow the attached back exercises to help keep your back loose.  3) Nasal congestion: Try over-the-counter nasal rinses such as NeilMed Sinus Rinse and Zyrtec.  4) Acid reflux: Take over-the-counter H2 blockers such as famotidine 20 mg daily.   I want you to set up an appointment with a financial counselor to start working on the paperwork for orange card.   Lab Orders     Glucose, capillary     POC Hbg A1C    I have ordered the following medication/changed the following medications:   1. Increase your Lantus to 30 units at night 2. Increase Gabapentin to 300 mg twice daily   Please follow-up in 2 weeks  Should you have any questions or concerns please call the internal medicine clinic at [phone].    Robert Ku, MD, MPH  Internal Medicine   My Chart Access: [email or web address]   If you have not already done so, please get your COVID 19 vaccine  To schedule an appointment for a COVID vaccine choice any of the following: Go to [email or web address]   Go to [email or web address]                  Call [phone]                                     Call [phone] and select Option 2  Back Exercises These exercises help to make your trunk and back strong. They also help to keep the lower back flexible. Doing these exercises can help to prevent back pain or lessen existing  pain.  If you have back pain, try to do these exercises 2-3 times each day or as told by your doctor.  As you get better, do the exercises once each day. Repeat the exercises more often as told by your doctor.  To stop back pain from coming back, do the exercises once each day, or as told by your doctor. Exercises Single knee to chest Do these steps 3-5 times in a row for each leg: 1. Lie on your back on a firm bed or the floor with your legs stretched out. 2. Bring one knee to your chest. 3. Grab your knee or thigh with both hands and hold them it in place. 4. Pull on your knee until you feel a gentle stretch in your lower back or buttocks. 5. Keep doing the stretch for 10-30 seconds. 6. Slowly let go of your leg and straighten it. Pelvic tilt Do these steps 5-10 times in a row: 1. Lie on your back on a firm bed or the floor with your legs stretched out. 2. Bend your knees so they point up to the ceiling. Your feet should be flat on the floor. 3. Tighten your lower belly (abdomen) muscles to press your lower back against the floor. This will make your  tailbone point up to the ceiling instead of pointing down to your feet or the floor. 4. Stay in this position for 5-10 seconds while you gently tighten your muscles and breathe evenly. Cat-cow Do these steps until your lower back bends more easily: 1. Get on your hands and knees on a firm surface. Keep your hands under your shoulders, and keep your knees under your hips. You may put padding under your knees. 2. Let your head hang down toward your chest. Tighten (contract) the muscles in your belly. Point your tailbone toward the floor so your lower back becomes rounded like the back of a cat. 3. Stay in this position for 5 seconds. 4. Slowly lift your head. Let the muscles of your belly relax. Point your tailbone up toward the ceiling so your back forms a sagging arch like the back of a cow. 5. Stay in this position for 5 seconds.    Press-ups Do these steps 5-10 times in a row: 1. Lie on your belly (face-down) on the floor. 2. Place your hands near your head, about shoulder-width apart. 3. While you keep your back relaxed and keep your hips on the floor, slowly straighten your arms to raise the top half of your body and lift your shoulders. Do not use your back muscles. You may change where you place your hands in order to make yourself more comfortable. 4. Stay in this position for 5 seconds. 5. Slowly return to lying flat on the floor.   Bridges Do these steps 10 times in a row: 1. Lie on your back on a firm surface. 2. Bend your knees so they point up to the ceiling. Your feet should be flat on the floor. Your arms should be flat at your sides, next to your body. 3. Tighten your butt muscles and lift your butt off the floor until your waist is almost as high as your knees. If you do not feel the muscles working in your butt and the back of your thighs, slide your feet 1-2 inches farther away from your butt. 4. Stay in this position for 3-5 seconds. 5. Slowly lower your butt to the floor, and let your butt muscles relax. If this exercise is too easy, try doing it with your arms crossed over your chest.   Belly crunches Do these steps 5-10 times in a row: 1. Lie on your back on a firm bed or the floor with your legs stretched out. 2. Bend your knees so they point up to the ceiling. Your feet should be flat on the floor. 3. Cross your arms over your chest. 4. Tip your chin a little bit toward your chest but do not bend your neck. 5. Tighten your belly muscles and slowly raise your chest just enough to lift your shoulder blades a tiny bit off of the floor. Avoid raising your body higher than that, because it can put too much stress on your low back. 6. Slowly lower your chest and your head to the floor. Back lifts Do these steps 5-10 times in a row: 1. Lie on your belly (face-down) with your arms at your sides, and  rest your forehead on the floor. 2. Tighten the muscles in your legs and your butt. 3. Slowly lift your chest off of the floor while you keep your hips on the floor. Keep the back of your head in line with the curve in your back. Look at the floor while you do this. 4. Stay in  this position for 3-5 seconds. 5. Slowly lower your chest and your face to the floor. Contact a doctor if:  Your back pain gets a lot worse when you do an exercise.  Your back pain does not get better 2 hours after you exercise. If you have any of these problems, stop doing the exercises. Do not do them again unless your doctor says it is okay. Get help right away if:  You have sudden, very bad back pain. If this happens, stop doing the exercises. Do not do them again unless your doctor says it is okay. This information is not intended to replace advice given to you by your health care provider. Make sure you discuss any questions you have with your health care provider. Document Revised: 03/21/2018 Document Reviewed: 03/21/2018 Elsevier Patient Education  2021 [employer].

## 2020-10-15 NOTE — Assessment & Plan Note (Signed)
Patient reports ongoing sinus congestion for the last few months There is associated clear nasal discharge and postnasal discharge. His symptoms are worse at night. The On chart review, patient was diagnosed with URI in December at [facility]. Current presentation is likely allergic rhinitis as a viral infection would have cleared up by now. Patient states he was started on albuterol prn for bronchitis. With family hx of asthma in mother, patient likely has asthma and would need to be assess for this down the line. Patient does not have insurance and unable to afford most prescriptions or referrals.   Plan: --Instructed to use OTC nasal rinses daily --Instructed to try OTC antihistamines such as Zyrtec --Advised to elevated head of bed when sleeping.

## 2020-10-15 NOTE — Progress Notes (Signed)
   CC: To establish care  HPI:  [name] is a 35 y.o. M with PMH as below who presents to the clinic to establish care and address his multiple complaints. Please see problem based charting for evaluation, assessment and plan.  Past Medical History:  Diagnosis Date  . Diabetes mellitus   . G6PD deficiency   . Sleep apnea    Medications: Lantus 25 units daily at bedtime NovoLog 15 units 3 times daily before meals Albuterol 1-2 puffs, every 6 hours as needed Gabapentin 300 mg daily Flexeril 10 mg 3 times daily as needed back spasm  Allergies:  Patient has G6PD disorder so should avoid penicillins, sulfa drugs and fava beans  Family history: History of diabetes and hypertension in mother and father. History of asthma in mother. Mother passed away from congestive heart failure.  Social history: Patient was born in [place] but has lived in [place] for the past 12 years.  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  He enjoys fishing. States his mother passed away 2 years ago since then he has been smoking 1-2 cigarettes a week and drinking 6 packs of beer/day. He has been smoking weed since he was a teenager but denies using other illicit drugs. Patient states his diet includes chicken (any type), fish and veggies.  States he does not eat a lot of sweets. Of note, patient states he has applied for Medicaid in the past and was denied. He has had trouble affording medications or referral to specialists.  Review of Systems:  Constitutional: Negative for fever, weight changes or fatigue. Positive for polydipsia.  HEENT: Positive for nasal congestion. Negative for sore throat, watery eyes or eye Respiratory: Negative for shortness of breath or cough Cardiac: Negative for chest pain MSK: Negative for back pain GU: Positive for polyuria. Negative for urinary incontinence or dysuria Abdomen: Negative for abdominal pain, constipation or  diarrhea Neuro: Negative for weakness. Positive for headache and numbness  Physical Exam: General: Pleasant, well-appearing and tattooed middle-age man with wife in the room. No acute distress. Head: Normocephalic/atraumatic Eyes: No sclera icterus. EOMI. PERRLA Mouth: MMM. No oropharyngeal erythema. Mild postnatal drip.  Nose: Mild swelling of nasal mucosa, no erythema or polyps. Clear nasal discharge. Mild tenderness to palpation of the nasal sinuses.  Neck: Supple. No lymphadenopathy Cardiac: RRR. No murmurs, rubs or gallops.  No LE edema Respiratory: Lungs CTAB. No wheezing or crackles. Abdominal: Soft, symmetric and non tender. Normal BS. No organomegaly.  MSK: Tenderness to the left lower paraspinal muscles. No point tenderness. Full ROM of back.  Skin: Warm, dry and intact without rashes or lesions Extremities: Atraumatic. Full ROM. Palpable distal pulses. Developing calluses on both heels. Unkept, thick and elongated toe nails. No obvious sores under feet.  Neuro: A&O x 3. Moves all extremities. Decreased sensation to BLE up to the ankle.  Psych: Appropriate mood and affect.  Vitals:   10/15/20 0915  BP: (!) 143/86  Pulse: 98  Temp: (!) 97.5 F (36.4 C)  TempSrc: Oral  SpO2: 100%  Weight: 263 lb 11.2 oz (119.6 kg)  Height: 6\' 1"  (1.854 m)    Assessment & Plan:   See Encounters Tab for problem based charting.  Patient discussed with Dr. , MD, MPH

## 2020-10-15 NOTE — Progress Notes (Incomplete)
   CC: To establish care  HPI:  [name] is a 35 y.o. M with PMH as below who presents to the clinic to establish care and address his multiple complaints. Please see problem based charting for evaluation, assessment and plan.  Past Medical History:  Diagnosis Date  . Diabetes mellitus   . G6PD deficiency   . Sleep apnea    Medications: Lantus 25 units daily at bedtime NovoLog 15 units 3 times daily before meals Albuterol 1-2 puffs, every 6 hours as needed Gabapentin 300 mg daily Flexeril 10 mg 3 times daily as needed back spasm  Allergies:  Patient has G6PD disorder so should avoid penicillins, sulfa drugs and fava beans  Family history: History of diabetes and hypertension in mother and father. History of asthma in mother. Mother passed away from congestive heart failure.  Social history: Patient was born in [place] but has lived in [place] for the past 12 years.  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  He enjoys fishing. States his mother passed away 2 years ago since then he has been smoking 1-2 cigarettes a week and drinking 6 packs of beer/day. He has been smoking weed since he was a teenager but denies using other illicit drugs. Patient states his diet includes chicken (any type), fish and veggies.  States he does not eat a lot of sweets. Of note, patient states he has applied for Medicaid in the past and was denied. He has had trouble affording medications or referral to specialists.  Review of Systems:  Constitutional: Negative for fever, weight changes or fatigue. Positive for polydipsia.  HEENT: Positive for nasal congestion. Negative for sore throat, watery eyes or eye Respiratory: Negative for shortness of breath or cough Cardiac: Negative for chest pain MSK: Negative for back pain GU: Positive for polyuria. Negative for urinary incontinence or dysuria Abdomen: Negative for abdominal pain, constipation or  diarrhea Neuro: Negative for weakness. Positive for headache and numbness  Physical Exam: General: Pleasant, well-appearing and tattooed middle-age man with wife in the room. No acute distress. Head: Normocephalic/atraumatic Eyes: EOMI. PERRLA Mouth: MMM. No oropharyngeal erythema. Mild postnatal drip.  Nose: Mild swelling of nasal mucosa, no erythema or polyps. Clear nasal discharge. Mild tenderness to palpation of the nasal sinuses.  Neck: Supple. No lymphadenopathy Cardiac: RRR. No murmurs, rubs or gallops.  No LE edema Respiratory: Lungs CTAB. No wheezing or crackles. Abdominal: Soft, symmetric and non tender. Normal BS. Skin: Warm, dry and intact without rashes or lesions Extremities: Atraumatic. Full ROM. Palpable distal pulses. Developing calluses on both heels. Unkept, thick and elongated toe nails. No obvious sores under feet.  Neuro: A&O x 3. Moves all extremities. Decreased sensation to BLE up to the ankle.  Psych: Appropriate mood and affect.  Vitals:   10/15/20 0915  BP: (!) 143/86  Pulse: 98  Temp: (!) 97.5 F (36.4 C)  TempSrc: Oral  SpO2: 100%  Weight: 263 lb 11.2 oz (119.6 kg)  Height: 6\' 1"  (1.854 m)    Assessment & Plan:   See Encounters Tab for problem based charting.  Patient discussed with Dr. , MD, MPH

## 2020-10-15 NOTE — Assessment & Plan Note (Signed)
Patient reports he has had acid reflux for many years. States his heartburn is worse at night and after eating. He has tried Tums with no relief. He did try his aunt's Nexium which gave him so temporary relief.   Plan: --Instructed to get OTC famotidine 20 mg daily --Advised to elevate bed at night and void eating late at night --Follow up in 2 weeks

## 2020-10-16 ENCOUNTER — Encounter: Payer: Self-pay | Admitting: Student

## 2020-10-16 NOTE — Assessment & Plan Note (Signed)
Patient reports he has had this diagnose since he was a child. Last CBC was in 2016 with no evidence of anemia. Would avoid sulfa drugs, nitrofurantoin, fluoroquinolones, fava beans, malaria meds and Dapsone. Patient denies any dyspnea, dark urine, or  fatigue. No sclera icterus on exam  Plan: --Check CBC at next OV --Avoid prescribing precipitating meds.
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
## 2020-10-16 NOTE — Assessment & Plan Note (Signed)
Patient's PHQ-9 is 15. According to patient, he has felt more depressed since his mother passed away 2 years ago. He has been drinking more and picked up smoking cigarettes. Wife believes patient will benefit from counseling. Patient does not feel ready to take medications for it.   Plan: --Appt with financial counselor to start process for orange Card --Refer to clinic therapist after pt gets [facility] card

## 2020-10-16 NOTE — Assessment & Plan Note (Addendum)
Patient with history of uncontrolled T2DM with neuropathy that was diagnosed in his 28s. A1c was 14 nine years ago and 10.2 today with a CBG of 308. Patient has had multiple ED visit for extremely elevated hyperglycemia without ketosis. He is on Lantus 25 units daily with Novolog 15 units before meals. He stopped his metformin years ago due to GI side effects. Patient states he has not been adherent to his insulin regimen as he only takes them when his blood sugars are really high. His symptoms include polyuria and polydipsia as well as headache and visual changes when his blood sugars are really high. Patient also has worsening diabetic neuropathy and unable to feel the bottom of his feet.   Plan: --Patient counseled on the importance of blood sugar control and the long-term complications of uncontrolled diabetes. --Increase Lantus to 30 units at bedtime --Start metformin 500 mg daily w/ breakfast for 1 week and escalate slowly.  --Continue Novolog 15 units before meals --Increased gabapentin to 300 mg twice daily --Instructed to check daily fasting sugars --Counseled on the importance of eating a healthy diet and reducing sugars in diet. --Refer to diabetes educator once patient is able to get [facility] card --Follow up in 2 weeks

## 2020-10-18 NOTE — Progress Notes (Signed)
Internal Medicine Clinic Attending  Case discussed with Dr. Kirke Corin  At the time of the visit.  We reviewed the resident's history and exam and pertinent patient test results.  I agree with the assessment, diagnosis, and plan of care documented in Dr. Jari Sportsman note.  He is striving to provide best care possible in this resource-limited situation.  Getting Robert Burton back on a consistent pattern of monitoring his DM and taking medication reliably will be first significant step.

## 2020-11-02 ENCOUNTER — Ambulatory Visit: Payer: Medicaid Other

## 2020-11-02 ENCOUNTER — Encounter: Payer: Medicaid Other | Admitting: Student

## 2021-02-03 ENCOUNTER — Other Ambulatory Visit: Payer: Self-pay

## 2021-02-03 ENCOUNTER — Telehealth (HOSPITAL_COMMUNITY): Payer: Self-pay

## 2021-02-03 ENCOUNTER — Encounter (HOSPITAL_COMMUNITY): Payer: Self-pay

## 2021-02-03 ENCOUNTER — Ambulatory Visit (HOSPITAL_COMMUNITY)
Admission: EM | Admit: 2021-02-03 | Discharge: 2021-02-03 | Disposition: A | Discharge status: Home / Self Care | Payer: Medicaid Other | Attending: Internal Medicine | Admitting: Internal Medicine

## 2021-02-03 DIAGNOSIS — K2900 Acute gastritis without bleeding: Secondary | ICD-10-CM

## 2021-02-03 LAB — LIPASE, BLOOD: Lipase: 45 U/L (ref 11–51)

## 2021-02-03 MED ORDER — PANTOPRAZOLE SODIUM 20 MG PO TBEC: 20.0000 mg | DELAYED_RELEASE_TABLET | Freq: Every day | ORAL | 1 refills | Status: AC

## 2021-02-03 MED ORDER — ALUM & MAG HYDROXIDE-SIMETH 200-200-20 MG/5ML PO SUSP
30.0000 mL | Freq: Once | ORAL | Status: AC
  Administered 2021-02-03: 30 mL via ORAL

## 2021-02-03 MED ORDER — ALUM & MAG HYDROXIDE-SIMETH 200-200-20 MG/5ML PO SUSP
ORAL | Status: AC
  Filled 2021-02-03: qty 30

## 2021-02-03 MED ORDER — LIDOCAINE VISCOUS HCL 2 % MT SOLN
15.0000 mL | Freq: Once | OROMUCOSAL | Status: AC
  Administered 2021-02-03: 15 mL via ORAL

## 2021-02-03 MED ORDER — PANTOPRAZOLE SODIUM 20 MG PO TBEC: 20.0000 mg | DELAYED_RELEASE_TABLET | Freq: Every day | ORAL | 1 refills | Status: DC

## 2021-02-03 MED ORDER — ONDANSETRON 4 MG PO TBDP: 4.0000 mg | ORAL_TABLET | Freq: Three times a day (TID) | ORAL | 0 refills | Status: DC | PRN

## 2021-02-03 MED ORDER — LIDOCAINE VISCOUS HCL 2 % MT SOLN
OROMUCOSAL | Status: AC
  Filled 2021-02-03: qty 15

## 2021-02-03 NOTE — Telephone Encounter (Signed)
Pt phone call returned. Medication re sent to correct pharmacy.

## 2021-02-03 NOTE — ED Triage Notes (Signed)
Pt reports headache and abdomina pain z 5 days. Abdominal pain worse after eating.

## 2021-02-03 NOTE — Discharge Instructions (Addendum)
Take medications as prescribed If you have worse abdominal pain please return to urgent care to be reevaluated I will encourage you to go on a soft diet and advance as tolerated If abdominal pain persists for more than a month despite medications, you will benefit from a gastroenterologist evaluation. We will call you with recommendations if labs are abnormal.

## 2021-02-04 NOTE — ED Provider Notes (Addendum)
[facility]    CSN: [id number] Arrival date & time: 02/03/21  4098      History   Chief Complaint Chief Complaint  Patient presents with   Abdominal Pain   Headache    HPI Robert Burton is a 35 y.o. male comes to the urgent care with epic strict abdominal pain of 5 days duration.  Symptoms started 5 days ago and has been persistent.  Pain is sharp and throbbing.  Pain is aggravated by food.  Pain is currently 8 out of 10.  He had an episode of emesis this morning after he ate.  No known relieving factors.  Pain does not radiate to his back.  His oral intake has been poor because of the pain.  No change in stool.  No melanotic stool.  HPI  Past Medical History:  Diagnosis Date   Diabetes mellitus    G6PD deficiency    Sleep apnea     Patient Active Problem List   Diagnosis Date Noted   Diabetes mellitus (HCC) 10/15/2020   Allergic rhinitis 10/15/2020   Depression 10/15/2020   G6PD deficiency 10/15/2020   GERD (gastroesophageal reflux disease) 10/15/2020   Low back pain 10/15/2020    Past Surgical History:  Procedure Laterality Date   TONSILLECTOMY         Home Medications    Prior to Admission medications   Medication Sig Start Date End Date Taking? Authorizing Provider  albuterol (PROAIR HFA) 108 (90 Base) MCG/ACT inhaler Inhale 1-2 puffs into the lungs every 6 (six) hours as needed for wheezing or shortness of breath. 06/13/20   Georgetta Haber, NP  gabapentin (NEURONTIN) 300 MG capsule Take 1 capsule (300 mg total) by mouth 2 (two) times daily. 10/15/20 12/14/20  Steffanie Rainwater, MD  insulin aspart (NOVOLOG) 100 UNIT/ML injection Inject 15 Units into the skin 3 (three) times daily before meals. 10/15/20   Steffanie Rainwater, MD  insulin glargine (LANTUS) 100 UNIT/ML injection Inject 0.3 mLs (30 Units total) into the skin at bedtime. 10/15/20   Steffanie Rainwater, MD  metFORMIN (GLUCOPHAGE) 500 MG tablet Take 1 tablet (500 mg total) by mouth daily  with breakfast for 7 days, THEN 1 tablet (500 mg total) 2 (two) times daily with a meal for 7 days. 10/15/20 10/29/20  Steffanie Rainwater, MD  NON FORMULARY     [provider]  ondansetron (ZOFRAN ODT) 4 MG disintegrating tablet Take 1 tablet (4 mg total) by mouth every 8 (eight) hours as needed for nausea or vomiting. 02/03/21   Kiron Osmun, Britta Mccreedy, MD  pantoprazole (PROTONIX) 20 MG tablet Take 1 tablet (20 mg total) by mouth daily. 02/03/21 03/05/21  LampteyBritta Mccreedy, MD    Family History Family History  Problem Relation Age of Onset   Diabetes Mother    Asthma Mother     Social History Social History   Tobacco Use   Smoking status: Every Day    Packs/day: 0.10    Types: Cigarettes   Smokeless tobacco: Never   Tobacco comments:    2 cigs per day  Vaping Use   Vaping Use: Never used  Substance Use Topics   Alcohol use: Yes    Comment: 2x/week   Drug use: Yes    Types: Marijuana     Allergies   Other   Review of Systems Review of Systems  Constitutional: Negative.   Respiratory: Negative.    Gastrointestinal:  Positive for abdominal pain and nausea.  Negative for blood in stool, constipation and vomiting.    Physical Exam Triage Vital Signs ED Triage Vitals  Enc Vitals Group     BP 02/03/21 0924 130/90     Pulse Rate 02/03/21 0924 87     Resp 02/03/21 0924 18     Temp 02/03/21 0924 98 F (36.7 C)     Temp Source 02/03/21 0924 Oral     SpO2 02/03/21 0924 97 %     Weight --      Height --      Head Circumference --      Peak Flow --      Pain Score 02/03/21 0922 8     Pain Loc --      Pain Edu? --      Excl. in GC? --    No data found.  Updated Vital Signs BP 130/90 (BP Location: Right Arm)   Pulse 87   Temp 98 F (36.7 C) (Oral)   Resp 18   SpO2 97%   Visual Acuity Right Eye Distance:   Left Eye Distance:   Bilateral Distance:    Right Eye Near:   Left Eye Near:    Bilateral Near:     Physical Exam Vitals and nursing note reviewed.   Constitutional:      General: He is in acute distress.     Appearance: He is not ill-appearing, toxic-appearing or diaphoretic.  Cardiovascular:     Rate and Rhythm: Normal rate and regular rhythm.     Heart sounds: Normal heart sounds.  Pulmonary:     Effort: Pulmonary effort is normal.     Breath sounds: Normal breath sounds.  Abdominal:     General: Abdomen is flat.     Palpations: Abdomen is soft.     Tenderness: There is abdominal tenderness in the epigastric area. There is no guarding or rebound. Negative signs include McBurney's sign.  Neurological:     Mental Status: He is alert.     UC Treatments / Results  Labs (all labs ordered are listed, but only abnormal results are displayed) Labs Reviewed  LIPASE, BLOOD    EKG   Radiology No results found.  Procedures Procedures (including critical care time)  Medications Ordered in UC Medications  alum & mag hydroxide-simeth (MAALOX/MYLANTA) 200-200-20 MG/5ML suspension 30 mL (30 mLs Oral Given 02/03/21 1007)    And  lidocaine (XYLOCAINE) 2 % viscous mouth solution 15 mL (15 mLs Oral Given 02/03/21 1007)    Initial Impression / Assessment and Plan / UC Course  I have reviewed the triage vital signs and the nursing notes.  Pertinent labs & imaging results that were available during my care of the patient were reviewed by me and considered in my medical decision making (see chart for details).     1.  Acute superficial gastritis without hemorrhage: GI cocktail x1 dose Protonix 20 mg orally daily for 30 days Zofran as needed for nausea and vomiting. Salt diet recommended.  Advance as tolerated If worsening pain persists, you may benefit from gastroenterology evaluation. Final Clinical Impressions(s) / UC Diagnoses   Final diagnoses:  Acute superficial gastritis without hemorrhage     Discharge Instructions      Take medications as prescribed If you have worse abdominal pain please return to urgent care to  be reevaluated I will encourage you to go on a soft diet and advance as tolerated If abdominal pain persists for more than a month despite medications, you  will benefit from a gastroenterologist evaluation. We will call you with recommendations if labs are abnormal.   ED Prescriptions     Medication Sig Dispense Auth. Provider   pantoprazole (PROTONIX) 20 MG tablet Take 1 tablet (20 mg total) by mouth daily. 30 tablet Shontia Gillooly, Britta Mccreedy, MD   ondansetron (ZOFRAN ODT) 4 MG disintegrating tablet Take 1 tablet (4 mg total) by mouth every 8 (eight) hours as needed for nausea or vomiting. 20 tablet Hilda Wexler, Britta Mccreedy, MD      PDMP not reviewed this encounter.   Merrilee Jansky, MD 02/04/21 1603    Merrilee Jansky, MD 02/04/21 [phone]

## 2021-05-24 ENCOUNTER — Encounter (HOSPITAL_COMMUNITY): Payer: Self-pay

## 2021-05-24 ENCOUNTER — Ambulatory Visit (HOSPITAL_COMMUNITY)
Admission: EM | Admit: 2021-05-24 | Discharge: 2021-05-24 | Disposition: A | Discharge status: Home / Self Care | Payer: Medicaid Other | Attending: Student | Admitting: Student

## 2021-05-24 ENCOUNTER — Other Ambulatory Visit: Payer: Self-pay

## 2021-05-24 DIAGNOSIS — G43001 Migraine without aura, not intractable, with status migrainosus: Secondary | ICD-10-CM

## 2021-05-24 LAB — CBG MONITORING, ED: Glucose-Capillary: 278 mg/dL — ABNORMAL HIGH (ref 70–99)

## 2021-05-24 MED ORDER — KETOROLAC TROMETHAMINE 30 MG/ML IJ SOLN
INTRAMUSCULAR | Status: AC
  Filled 2021-05-24: qty 1

## 2021-05-24 MED ORDER — DEXAMETHASONE SODIUM PHOSPHATE 10 MG/ML IJ SOLN
INTRAMUSCULAR | Status: AC
  Filled 2021-05-24: qty 1

## 2021-05-24 MED ORDER — KETOROLAC TROMETHAMINE 30 MG/ML IJ SOLN
30.0000 mg | Freq: Once | INTRAMUSCULAR | Status: AC
  Administered 2021-05-24: 30 mg via INTRAMUSCULAR

## 2021-05-24 MED ORDER — DEXAMETHASONE SODIUM PHOSPHATE 10 MG/ML IJ SOLN
10.0000 mg | Freq: Once | INTRAMUSCULAR | Status: AC
  Administered 2021-05-24: 10 mg via INTRAMUSCULAR

## 2021-05-24 NOTE — ED Triage Notes (Signed)
Pt reports headache and neck pain x 2 days.

## 2021-05-24 NOTE — Discharge Instructions (Addendum)
-  Starting tomorrow: -You can take Tylenol up to 1000 mg 3 times daily, and ibuprofen up to 600 mg 3 times daily with food.  You can take these together, or alternate every 3-4 hours. -Seek additional immediate medical attention if the worst headache of your life; dizziness; vision changes; weakness; shortness of breath

## 2021-05-24 NOTE — ED Provider Notes (Signed)
[facility]    CSN: [id number] Arrival date & time: 05/24/21  1005      History   Chief Complaint Chief Complaint  Patient presents with   Neck Pain   Headache    HPI Corrado Hymon is a 35 y.o. male presenting with headaches and neck pain x2 days. Medical history G6PD deficiency.  Describes throbbing frontal headache and photophobia for 2 days.  Also with tense muscles in the neck.  Nausea without vomiting.  Symptoms are temporarily relieved with ibuprofen, but then returned. Denies worst headache of life, thunderclap headache, weakness/sensation changes in arms/legs, vision changes, shortness of breath, chest pain/pressure, photophobia, phonophobia, n/v/d, scalp or temple pain, neck stiffness.   HPI  Past Medical History:  Diagnosis Date   Diabetes mellitus    G6PD deficiency    Sleep apnea     Patient Active Problem List   Diagnosis Date Noted   Diabetes mellitus (HCC) 10/15/2020   Allergic rhinitis 10/15/2020   Depression 10/15/2020   G6PD deficiency 10/15/2020   GERD (gastroesophageal reflux disease) 10/15/2020   Low back pain 10/15/2020    Past Surgical History:  Procedure Laterality Date   TONSILLECTOMY         Home Medications    Prior to Admission medications   Medication Sig Start Date End Date Taking? Authorizing Provider  albuterol (PROAIR HFA) 108 (90 Base) MCG/ACT inhaler Inhale 1-2 puffs into the lungs every 6 (six) hours as needed for wheezing or shortness of breath. 06/13/20   Georgetta Haber, NP  gabapentin (NEURONTIN) 300 MG capsule Take 1 capsule (300 mg total) by mouth 2 (two) times daily. 10/15/20 12/14/20  Steffanie Rainwater, MD  insulin aspart (NOVOLOG) 100 UNIT/ML injection Inject 15 Units into the skin 3 (three) times daily before meals. 10/15/20   Steffanie Rainwater, MD  insulin glargine (LANTUS) 100 UNIT/ML injection Inject 0.3 mLs (30 Units total) into the skin at bedtime. 10/15/20   Steffanie Rainwater, MD  metFORMIN  (GLUCOPHAGE) 500 MG tablet Take 1 tablet (500 mg total) by mouth daily with breakfast for 7 days, THEN 1 tablet (500 mg total) 2 (two) times daily with a meal for 7 days. 10/15/20 10/29/20  Steffanie Rainwater, MD  NON FORMULARY     [provider]  ondansetron (ZOFRAN ODT) 4 MG disintegrating tablet Take 1 tablet (4 mg total) by mouth every 8 (eight) hours as needed for nausea or vomiting. 02/03/21   Lamptey, Britta Mccreedy, MD  pantoprazole (PROTONIX) 20 MG tablet Take 1 tablet (20 mg total) by mouth daily. 02/03/21 03/05/21  LampteyBritta Mccreedy, MD    Family History Family History  Problem Relation Age of Onset   Diabetes Mother    Asthma Mother     Social History Social History   Tobacco Use   Smoking status: Every Day    Packs/day: 0.10    Types: Cigarettes   Smokeless tobacco: Never   Tobacco comments:    2 cigs per day  Vaping Use   Vaping Use: Never used  Substance Use Topics   Alcohol use: Yes    Comment: 2x/week   Drug use: Yes    Types: Marijuana     Allergies   Other   Review of Systems Review of Systems  Neurological:  Positive for headaches.  All other systems reviewed and are negative.   Physical Exam Triage Vital Signs ED Triage Vitals  Enc Vitals Group     BP 05/24/21 1142 Marland Kitchen)  149/107     Pulse Rate 05/24/21 1142 99     Resp 05/24/21 1142 20     Temp 05/24/21 1142 98.6 F (37 C)     Temp Source 05/24/21 1142 Oral     SpO2 05/24/21 1142 97 %     Weight --      Height --      Head Circumference --      Peak Flow --      Pain Score 05/24/21 1141 8     Pain Loc --      Pain Edu? --      Excl. in GC? --    No data found.  Updated Vital Signs BP (!) 149/107 (BP Location: Right Arm)   Pulse 99   Temp 98.6 F (37 C) (Oral)   Resp 20   SpO2 97%   Visual Acuity Right Eye Distance:   Left Eye Distance:   Bilateral Distance:    Right Eye Near:   Left Eye Near:    Bilateral Near:     Physical Exam Vitals reviewed.  Constitutional:       General: He is not in acute distress.    Appearance: Normal appearance. He is not ill-appearing.  HENT:     Head: Normocephalic and atraumatic.  Eyes:     Extraocular Movements: Extraocular movements intact.     Pupils: Pupils are equal, round, and reactive to light.  Cardiovascular:     Rate and Rhythm: Normal rate and regular rhythm.     Heart sounds: Normal heart sounds.  Pulmonary:     Effort: Pulmonary effort is normal.     Breath sounds: Normal breath sounds. No wheezing, rhonchi or rales.  Musculoskeletal:     Cervical back: Normal range of motion and neck supple. No rigidity.  Lymphadenopathy:     Cervical: No cervical adenopathy.  Skin:    Capillary Refill: Capillary refill takes less than 2 seconds.  Neurological:     General: No focal deficit present.     Mental Status: He is alert and oriented to person, place, and time. Mental status is at baseline.     Cranial Nerves: No cranial nerve deficit or facial asymmetry.     Sensory: Sensation is intact. No sensory deficit.     Motor: Motor function is intact. No weakness.     Coordination: Coordination is intact. Coordination normal.     Gait: Gait is intact. Gait normal.     Comments: CN 2-12 intact. No weakness or numbness in UEs or LEs. PERRLA, EOMI. Negative rhomberg, pronator drift. Negative fingers to thumb.   Psychiatric:        Mood and Affect: Mood normal.        Behavior: Behavior normal.        Thought Content: Thought content normal.        Judgment: Judgment normal.     UC Treatments / Results  Labs (all labs ordered are listed, but only abnormal results are displayed) Labs Reviewed  CBG MONITORING, ED - Abnormal; Notable for the following components:      Result Value   Glucose-Capillary 278 (*)    All other components within normal limits    EKG   Radiology No results found.  Procedures Procedures (including critical care time)  Medications Ordered in UC Medications  ketorolac (TORADOL)  30 MG/ML injection 30 mg (30 mg Intramuscular Given 05/24/21 1241)  dexamethasone (DECADRON) injection 10 mg (10 mg Intramuscular Given 05/24/21 1241)  Initial Impression / Assessment and Plan / UC Course  I have reviewed the triage vital signs and the nursing notes.  Pertinent labs & imaging results that were available during my care of the patient were reviewed by me and considered in my medical decision making (see chart for details).     This patient is a very pleasant 35 y.o. year old male presenting with migraine headache. Benign neuro exam. Migraine cocktail (toradol and dexamethasone) administered with improvement in symptoms. He has not taken any NSAIDs today. Discharged to home with work note and return precautions.  Final Clinical Impressions(s) / UC Diagnoses   Final diagnoses:  Migraine without aura and with status migrainosus, not intractable     Discharge Instructions      -Starting tomorrow: -You can take Tylenol up to 1000 mg 3 times daily, and ibuprofen up to 600 mg 3 times daily with food.  You can take these together, or alternate every 3-4 hours. -Seek additional immediate medical attention if the worst headache of your life; dizziness; vision changes; weakness; shortness of breath   ED Prescriptions   None    PDMP not reviewed this encounter.   Rhys Martini, PA-C 05/24/21 1333

## 2021-05-24 NOTE — ED Notes (Signed)
[facility] was called 

## 2021-06-03 ENCOUNTER — Encounter (HOSPITAL_COMMUNITY): Payer: Self-pay | Admitting: Emergency Medicine

## 2021-06-03 ENCOUNTER — Ambulatory Visit (HOSPITAL_COMMUNITY)
Admission: EM | Admit: 2021-06-03 | Discharge: 2021-06-03 | Disposition: A | Discharge status: Home / Self Care | Payer: Medicaid Other | Attending: Emergency Medicine | Admitting: Emergency Medicine

## 2021-06-03 ENCOUNTER — Other Ambulatory Visit: Payer: Self-pay

## 2021-06-03 DIAGNOSIS — M545 Low back pain, unspecified: Secondary | ICD-10-CM

## 2021-06-03 DIAGNOSIS — E1140 Type 2 diabetes mellitus with diabetic neuropathy, unspecified: Secondary | ICD-10-CM

## 2021-06-03 DIAGNOSIS — Z794 Long term (current) use of insulin: Secondary | ICD-10-CM
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
## 2021-06-03 NOTE — Discharge Instructions (Addendum)
600 mg of ibuprofen combined with a Tylenol  3 or 4 times a day.  Many people find gentle stretching and deep tissue massage helpful. Try [facility] on [address]. They have very reasonable prices and take walk ins. Or you can go to  [facility]. Follow-up with your primary care physician in several days for further management of your sugars, go to the ER for the signs and symptoms we discussed.  Go to www.goodrx.com  or www.costplusdrugs.com to look up your medications. This will give you a list of where you can find your prescriptions at the most affordable prices. Or ask the pharmacist what the cash price is, or if they have any other discount programs available to help make your medication more affordable. This can be less expensive than what you would pay with insurance.

## 2021-06-03 NOTE — ED Triage Notes (Signed)
Pt is present today with lower back pain and numbness in his left foot. Pt states back pain started 5-6 days ago and the foot numbness started 3-4 days ago.

## 2021-07-17 ENCOUNTER — Other Ambulatory Visit: Payer: Self-pay

## 2021-07-17 ENCOUNTER — Encounter (HOSPITAL_COMMUNITY): Payer: Self-pay | Admitting: *Deleted

## 2021-07-17 ENCOUNTER — Ambulatory Visit (HOSPITAL_COMMUNITY)
Admission: EM | Admit: 2021-07-17 | Discharge: 2021-07-17 | Disposition: A | Discharge status: Home / Self Care | Payer: Medicaid Other | Attending: Internal Medicine | Admitting: Internal Medicine

## 2021-07-17 DIAGNOSIS — Z794 Long term (current) use of insulin: Secondary | ICD-10-CM | POA: Insufficient documentation

## 2021-07-17 DIAGNOSIS — Z20822 Contact with and (suspected) exposure to covid-19: Secondary | ICD-10-CM | POA: Insufficient documentation

## 2021-07-17 DIAGNOSIS — B349 Viral infection, unspecified: Secondary | ICD-10-CM | POA: Insufficient documentation

## 2021-07-17 DIAGNOSIS — E1165 Type 2 diabetes mellitus with hyperglycemia: Secondary | ICD-10-CM

## 2021-07-17 DIAGNOSIS — R000 Tachycardia, unspecified: Secondary | ICD-10-CM | POA: Insufficient documentation

## 2021-07-17 DIAGNOSIS — E119 Type 2 diabetes mellitus without complications: Secondary | ICD-10-CM

## 2021-07-17 DIAGNOSIS — R112 Nausea with vomiting, unspecified: Secondary | ICD-10-CM

## 2021-07-17 DIAGNOSIS — R0789 Other chest pain: Secondary | ICD-10-CM | POA: Insufficient documentation

## 2021-07-17 DIAGNOSIS — F17210 Nicotine dependence, cigarettes, uncomplicated: Secondary | ICD-10-CM | POA: Insufficient documentation

## 2021-07-17 LAB — POC INFLUENZA A AND B ANTIGEN (URGENT CARE ONLY)
INFLUENZA A ANTIGEN, POC: NEGATIVE
INFLUENZA B ANTIGEN, POC: NEGATIVE

## 2021-07-17 LAB — POCT URINALYSIS DIPSTICK, ED / UC
Bilirubin Urine: NEGATIVE
Glucose, UA: 500 mg/dL — AB
Leukocytes,Ua: NEGATIVE
Nitrite: NEGATIVE
Protein, ur: 30 mg/dL — AB
Specific Gravity, Urine: 1.025 (ref 1.005–1.030)
Urobilinogen, UA: 0.2 mg/dL (ref 0.0–1.0)
pH: 6 (ref 5.0–8.0)

## 2021-07-17 LAB — CBG MONITORING, ED: Glucose-Capillary: 267 mg/dL — ABNORMAL HIGH (ref 70–99)

## 2021-07-17 MED ORDER — CETIRIZINE HCL 10 MG PO TABS: 10.0000 mg | ORAL_TABLET | Freq: Every day | ORAL | 0 refills | Status: AC

## 2021-07-17 MED ORDER — BENZONATATE 100 MG PO CAPS: 100.0000 mg | ORAL_CAPSULE | Freq: Three times a day (TID) | ORAL | 0 refills | Status: DC | PRN

## 2021-07-17 MED ORDER — ACETAMINOPHEN 325 MG PO TABS: 650.0000 mg | ORAL_TABLET | Freq: Four times a day (QID) | ORAL | 0 refills | Status: AC | PRN

## 2021-07-17 MED ORDER — PROMETHAZINE-DM 6.25-15 MG/5ML PO SYRP: 5.0000 mL | ORAL_SOLUTION | Freq: Every evening | ORAL | 0 refills | Status: DC | PRN

## 2021-07-17 NOTE — ED Provider Notes (Signed)
[facility]   MRN: [id number] DOB: 09-11-1985  Subjective:   Robert Burton is a 36 y.o. male presenting for 2 to 3-day history of acute onset watery mouth feeling, 2 episodes of vomiting, midsternal chest pain that elicits shortness of breath.  He is also felt fatigued, had a mild cough.  Has had some sinus drainage, frontal left headache.  Patient did a COVID test at home and was negative.  He is a type II diabetic treated with insulin.  He does smoke a few cigarettes a day.  Also smokes marijuana 2-3 times out of the week.  He has 2 beers at a time twice a week.  No other drug use.  No nausea, abdominal pain, dysuria, hematuria, body aches, weakness, numbness or tingling.  No history of MI, heart disease, stroke.  No current facility-administered medications for this encounter.  Current Outpatient Medications:    albuterol (PROAIR HFA) 108 (90 Base) MCG/ACT inhaler, Inhale 1-2 puffs into the lungs every 6 (six) hours as needed for wheezing or shortness of breath., Disp: 1 each, Rfl: 0   gabapentin (NEURONTIN) 300 MG capsule, Take 1 capsule (300 mg total) by mouth 2 (two) times daily., Disp: 60 capsule, Rfl: 1   insulin aspart (NOVOLOG) 100 UNIT/ML injection, Inject 15 Units into the skin 3 (three) times daily before meals., Disp: 10 mL, Rfl: 2   insulin glargine (LANTUS) 100 UNIT/ML injection, Inject 0.3 mLs (30 Units total) into the skin at bedtime., Disp: 10 mL, Rfl: 2   metFORMIN (GLUCOPHAGE) 500 MG tablet, Take 1 tablet (500 mg total) by mouth daily with breakfast for 7 days, THEN 1 tablet (500 mg total) 2 (two) times daily with a meal for 7 days., Disp: 60 tablet, Rfl: 1   NON FORMULARY, , Disp: , Rfl:    ondansetron (ZOFRAN ODT) 4 MG disintegrating tablet, Take 1 tablet (4 mg total) by mouth every 8 (eight) hours as needed for nausea or vomiting., Disp: 20 tablet, Rfl: 0   pantoprazole (PROTONIX) 20 MG tablet, Take 1 tablet (20 mg total) by mouth daily., Disp: 30  tablet, Rfl: 1   Allergies  Allergen Reactions   Other Other (See Comments)    GPD-6 disorder-penicillins and sulfa drugs along with others    Past Medical History:  Diagnosis Date   Diabetes mellitus    G6PD deficiency    Sleep apnea      Past Surgical History:  Procedure Laterality Date   TONSILLECTOMY      Family History  Problem Relation Age of Onset   Diabetes Mother    Asthma Mother     Social History   Tobacco Use   Smoking status: Every Day    Packs/day: 0.10    Types: Cigarettes   Smokeless tobacco: Never   Tobacco comments:    2 cigs per day  Vaping Use   Vaping Use: Never used  Substance Use Topics   Alcohol use: Yes    Comment: 2x/week   Drug use: Yes    Types: Marijuana    ROS   Objective:   Vitals: BP (!) 130/100    Pulse (!) 110    Temp (!) 97.3 F (36.3 C)    Resp 20    SpO2 98%   Physical Exam Constitutional:      General: He is not in acute distress.    Appearance: Normal appearance. He is well-developed and normal weight. He is not ill-appearing, toxic-appearing or diaphoretic.  HENT:     Head: Normocephalic and atraumatic.     Right Ear: External ear normal.     Left Ear: External ear normal.     Nose: Nose normal.     Mouth/Throat:     Mouth: Mucous membranes are moist.     Pharynx: No oropharyngeal exudate or posterior oropharyngeal erythema.  Eyes:     General: No scleral icterus.       Right eye: No discharge.        Left eye: No discharge.     Extraocular Movements: Extraocular movements intact.     Conjunctiva/sclera: Conjunctivae normal.  Neck:     Meningeal: Brudzinski's sign and Kernig's sign absent.  Cardiovascular:     Rate and Rhythm: Normal rate and regular rhythm.     Heart sounds: Normal heart sounds. No murmur heard.   No friction rub. No gallop.  Pulmonary:     Effort: Pulmonary effort is normal. No respiratory distress.     Breath sounds: Normal breath sounds. No stridor. No wheezing, rhonchi or  rales.  Abdominal:     General: Bowel sounds are normal. There is no distension.     Palpations: Abdomen is soft. There is no mass.     Tenderness: There is no abdominal tenderness. There is no guarding or rebound.  Musculoskeletal:     Cervical back: No rigidity.  Skin:    General: Skin is warm and dry.  Neurological:     Mental Status: He is oriented to person, place, and time. He is lethargic.     Cranial Nerves: No cranial nerve deficit, dysarthria or facial asymmetry.     Motor: No weakness.     Coordination: Romberg sign negative. Coordination normal.     Gait: Gait normal.     Deep Tendon Reflexes: Reflexes normal.  Psychiatric:        Mood and Affect: Mood normal.        Behavior: Behavior normal.        Thought Content: Thought content normal.        Judgment: Judgment normal.    Results for orders placed or performed during the hospital encounter of 07/17/21 (from the past 24 hour(s))  POC CBG, ED     Status: Abnormal   Collection Time: 07/17/21  3:32 PM  Result Value Ref Range   Glucose-Capillary 267 (H) 70 - 99 mg/dL   Comment 1 Document in Chart   POC Urinalysis dipstick     Status: Abnormal   Collection Time: 07/17/21  3:49 PM  Result Value Ref Range   Glucose, UA 500 (A) NEGATIVE mg/dL   Bilirubin Urine NEGATIVE NEGATIVE   Ketones, ur TRACE (A) NEGATIVE mg/dL   Specific Gravity, Urine 1.025 1.005 - 1.030   Hgb urine dipstick TRACE (A) NEGATIVE   pH 6.0 5.0 - 8.0   Protein, ur 30 (A) NEGATIVE mg/dL   Urobilinogen, UA 0.2 0.0 - 1.0 mg/dL   Nitrite NEGATIVE NEGATIVE   Leukocytes,Ua NEGATIVE NEGATIVE  POC Influenza A & B Ag (Urgent Care)     Status: None   Collection Time: 07/17/21  4:06 PM  Result Value Ref Range   INFLUENZA A ANTIGEN, POC NEGATIVE NEGATIVE   INFLUENZA B ANTIGEN, POC NEGATIVE NEGATIVE    Assessment and Plan :   PDMP not reviewed this encounter.  1. Acute viral syndrome   2. Nausea and vomiting, unspecified vomiting type   3. Type 2  diabetes mellitus treated without insulin ([place])  4. Atypical chest pain   5. Tachycardia    COVID testing pending.  Patient is lethargic and sleepy but alert during my exam with him and while giving HPI.  His rapid flu test is negative, urinalysis unremarkable.  He does have hyperglycemia but I am not concerned for diabetic ketoacidosis at this point given his level and exam.  He has reproducible chest pain over the mid sternum. Despite his borderline tachycardia, deferred imaging given clear cardiopulmonary exam.  Recommended conservative management with supportive care.  Will defer an ER visit for now.  Low suspicion for pulmonary embolism given the nature of his chest pain, low Wells score. Counseled patient on potential for adverse effects with medications prescribed/recommended today, ER and return-to-clinic precautions discussed, patient verbalized understanding.    Jaynee Eagles, [place] 07/17/21 [id number]

## 2021-07-17 NOTE — ED Triage Notes (Signed)
Pt reports vomiting and has that watery mouth feeling. Pt also has a HA.

## 2021-07-18 LAB — SARS CORONAVIRUS 2 (TAT 6-24 HRS): SARS Coronavirus 2: NEGATIVE

## 2021-10-28 ENCOUNTER — Ambulatory Visit (HOSPITAL_COMMUNITY)
Admission: EM | Admit: 2021-10-28 | Discharge: 2021-10-28 | Disposition: A | Discharge status: Home / Self Care | Payer: Medicaid Other | Attending: Family Medicine | Admitting: Family Medicine

## 2021-10-28 ENCOUNTER — Encounter (HOSPITAL_COMMUNITY): Payer: Self-pay | Admitting: Emergency Medicine

## 2021-10-28 DIAGNOSIS — S39012A Strain of muscle, fascia and tendon of lower back, initial encounter: Secondary | ICD-10-CM

## 2021-10-28 MED ORDER — TIZANIDINE HCL 4 MG PO TABS: 4.0000 mg | ORAL_TABLET | Freq: Four times a day (QID) | ORAL | 0 refills | Status: DC | PRN

## 2021-10-28 MED ORDER — KETOROLAC TROMETHAMINE 60 MG/2ML IM SOLN
60.0000 mg | Freq: Once | INTRAMUSCULAR | Status: AC
  Administered 2021-10-28: 60 mg via INTRAMUSCULAR

## 2021-10-28 MED ORDER — KETOROLAC TROMETHAMINE 60 MG/2ML IM SOLN
INTRAMUSCULAR | Status: AC
  Filled 2021-10-28: qty 2

## 2021-10-28 MED ORDER — IBUPROFEN 600 MG PO TABS: 600.0000 mg | ORAL_TABLET | Freq: Four times a day (QID) | ORAL | 0 refills | Status: AC | PRN
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
## 2022-02-04 ENCOUNTER — Encounter (HOSPITAL_COMMUNITY): Payer: Self-pay | Admitting: Emergency Medicine

## 2022-02-04 ENCOUNTER — Ambulatory Visit (HOSPITAL_COMMUNITY)
Admission: EM | Admit: 2022-02-04 | Discharge: 2022-02-04 | Disposition: A | Discharge status: Home / Self Care | Payer: Self-pay | Attending: Internal Medicine | Admitting: Internal Medicine

## 2022-02-04 ENCOUNTER — Other Ambulatory Visit: Payer: Self-pay

## 2022-02-04 DIAGNOSIS — G8929 Other chronic pain: Secondary | ICD-10-CM

## 2022-02-04 DIAGNOSIS — M5441 Lumbago with sciatica, right side: Secondary | ICD-10-CM

## 2022-02-04 MED ORDER — CYCLOBENZAPRINE HCL 5 MG PO TABS: 5.0000 mg | ORAL_TABLET | Freq: Two times a day (BID) | ORAL | 0 refills | Status: DC | PRN

## 2022-02-04 MED ORDER — KETOROLAC TROMETHAMINE 30 MG/ML IJ SOLN
30.0000 mg | Freq: Once | INTRAMUSCULAR | Status: AC
  Administered 2022-02-04: 30 mg via INTRAMUSCULAR

## 2022-02-04 MED ORDER — KETOROLAC TROMETHAMINE 30 MG/ML IJ SOLN
INTRAMUSCULAR | Status: AC
  Filled 2022-02-04: qty 1

## 2022-02-04 NOTE — Discharge Instructions (Addendum)
It appears that you are having low back pain with sciatica.  You were given an injection urgent care today to decrease pain and inflammation.  Please avoid taking any ibuprofen, Advil, Aleve for at least 24 hours following injection.  You have also been prescribed a different muscle relaxer.  Please do not take this muscle relaxer that you were previously prescribed in addition to this muscle relaxer as it can cause bad effects.  Please be advised that this muscle relaxer can also cause drowsiness so do not drive while taking this medication.  Alternate ice and heat to affected area.  Follow-up with orthopedist at provided contact information if pain persists or worsens.

## 2022-02-04 NOTE — ED Triage Notes (Addendum)
Back pain for 2 weeks.  Has been seen before for symptoms.  Pain has worsened.  Pain in lower back and down right leg and tailbone.  States foot is numb

## 2022-02-04 NOTE — ED Provider Notes (Signed)
[facility]    CSN: [id number] Arrival date & time: 02/04/22  1012      History   Chief Complaint Chief Complaint  Patient presents with   Back Pain    HPI Robert Burton is a 36 y.o. male.   Patient presents with right lower back pain that radiates down posterior leg to foot that has been present for about 2 weeks.  Patient has been seen multiple times for chronic back pain in this location.  Patient denies any obvious injury to the area.  He reports that he has been taking leftover ibuprofen and Zanaflex that was previously prescribed with minimal improvement.  Denies urinary frequency, urinary or bowel continence, saddle anesthesia.  He reports movement exacerbates pain and lying flat makes it worse.  Last took any pain medication early in the morning hours.   Back Pain   Past Medical History:  Diagnosis Date   Diabetes mellitus    G6PD deficiency    Sleep apnea     Patient Active Problem List   Diagnosis Date Noted   Diabetes mellitus ([facility]) 10/15/2020   Allergic rhinitis 10/15/2020   Depression 10/15/2020   G6PD deficiency 10/15/2020   GERD (gastroesophageal reflux disease) 10/15/2020   Low back pain 10/15/2020    Past Surgical History:  Procedure Laterality Date   TONSILLECTOMY         Home Medications    Prior to Admission medications   Medication Sig Start Date End Date Taking? Authorizing Provider  cyclobenzaprine (FLEXERIL) 5 MG tablet Take 1 tablet (5 mg total) by mouth 2 (two) times daily as needed for muscle spasms. 02/04/22  Yes Ketih Goodie, Acie Fredrickson, FNP  acetaminophen (TYLENOL) 325 MG tablet Take 2 tablets (650 mg total) by mouth every 6 (six) hours as needed. 07/17/21   Wallis Bamberg, PA-C  albuterol [facility] HFA) 108 [phone] Base) MCG/ACT inhaler Inhale 1-2 puffs into the lungs every 6 (six) hours as needed for wheezing or shortness of breath. 06/13/20   Georgetta Haber, NP  benzonatate (TESSALON) 100 MG capsule Take 1-2 capsules (100-200 mg total)  by mouth 3 (three) times daily as needed for cough. 07/17/21   Wallis Bamberg, PA-C  cetirizine (ZYRTEC ALLERGY) 10 MG tablet Take 1 tablet (10 mg total) by mouth daily. 07/17/21   Wallis Bamberg, PA-C  gabapentin (NEURONTIN) 300 MG capsule Take 1 capsule (300 mg total) by mouth 2 (two) times daily. 10/15/20 12/14/20  Steffanie Rainwater, MD  ibuprofen (ADVIL) 600 MG tablet Take 1 tablet (600 mg total) by mouth every 6 (six) hours as needed. 10/28/21   Piontek, Denny Peon, MD  insulin aspart (NOVOLOG) 100 UNIT/ML injection Inject 15 Units into the skin 3 (three) times daily before meals. 10/15/20   Steffanie Rainwater, MD  insulin glargine (LANTUS) 100 UNIT/ML injection Inject 0.3 mLs (30 Units total) into the skin at bedtime. 10/15/20   Steffanie Rainwater, MD  metFORMIN (GLUCOPHAGE) 500 MG tablet Take 1 tablet (500 mg total) by mouth daily with breakfast for 7 days, THEN 1 tablet (500 mg total) 2 (two) times daily with a meal for 7 days. 10/15/20 10/29/20  Steffanie Rainwater, MD  NON FORMULARY     [provider]  ondansetron (ZOFRAN ODT) 4 MG disintegrating tablet Take 1 tablet (4 mg total) by mouth every 8 (eight) hours as needed for nausea or vomiting. 02/03/21   Lamptey, Britta Mccreedy, MD  pantoprazole (PROTONIX) 20 MG tablet Take 1 tablet (20 mg total) by  mouth daily. 02/03/21 03/05/21  Merrilee Jansky, MD  promethazine-dextromethorphan (PROMETHAZINE-DM) 6.25-15 MG/5ML syrup Take 5 mLs by mouth at bedtime as needed for cough. Patient not taking: Reported on 02/04/2022 07/17/21   Wallis Bamberg, PA-C    Family History Family History  Problem Relation Age of Onset   Diabetes Mother    Asthma Mother     Social History Social History   Tobacco Use   Smoking status: Every Day    Packs/day: 0.10    Types: Cigarettes   Smokeless tobacco: Never   Tobacco comments:    2 cigs per day  Vaping Use   Vaping Use: Never used  Substance Use Topics   Alcohol use: Yes    Comment: 2x/week   Drug use: Yes    Types:  Marijuana     Allergies   Other   Review of Systems Review of Systems Per HPI  Physical Exam Triage Vital Signs ED Triage Vitals  Enc Vitals Group     BP 02/04/22 1032 129/83     Pulse Rate 02/04/22 1032 (!) 110     Resp 02/04/22 1032 20     Temp 02/04/22 1032 (!) 97.4 F (36.3 C)     Temp Source 02/04/22 1032 Oral     SpO2 02/04/22 1032 98 %     Weight --      Height --      Head Circumference --      Peak Flow --      Pain Score 02/04/22 1029 10     Pain Loc --      Pain Edu? --      Excl. in [place]? --    No data found.  Updated Vital Signs BP 129/83 (BP Location: Right Arm) Comment (BP Location): large cuff  Pulse (!) 110   Temp (!) 97.4 F (36.3 C) (Oral)   Resp 20   SpO2 98%   Visual Acuity Right Eye Distance:   Left Eye Distance:   Bilateral Distance:    Right Eye Near:   Left Eye Near:    Bilateral Near:     Physical Exam Constitutional:      General: He is not in acute distress.    Appearance: Normal appearance. He is not toxic-appearing or diaphoretic.  HENT:     Head: Normocephalic and atraumatic.  Eyes:     Extraocular Movements: Extraocular movements intact.     Conjunctiva/sclera: Conjunctivae normal.  Pulmonary:     Effort: Pulmonary effort is normal.  Musculoskeletal:     Comments: Tenderness to palpation to right lower lumbar region that extends into right buttocks.  No direct spinal tenderness, crepitus, step-off.  Unable to assess straight leg raise given patient cooperation and tolerability.  Neurovascular intact.  Neurological:     General: No focal deficit present.     Mental Status: He is alert and oriented to person, place, and time. Mental status is at baseline.     Deep Tendon Reflexes: Reflexes are normal and symmetric.  Psychiatric:        Mood and Affect: Mood normal.        Behavior: Behavior normal.        Thought Content: Thought content normal.        Judgment: Judgment normal.      UC Treatments / Results   Labs (all labs ordered are listed, but only abnormal results are displayed) Labs Reviewed - No data to display  EKG   Radiology No  results found.  Procedures Procedures (including critical care time)  Medications Ordered in UC Medications  ketorolac (TORADOL) 30 MG/ML injection 30 mg (30 mg Intramuscular Given 02/04/22 1108)    Initial Impression / Assessment and Plan / UC Course  I have reviewed the triage vital signs and the nursing notes.  Pertinent labs & imaging results that were available during my care of the patient were reviewed by me and considered in my medical decision making (see chart for details).     Suspect lumbar strain versus low back pain with sciatica.  This appears to be a chronic recurrent issue for patient as the patient was provided with contact information for orthopedist for further evaluation and management.  Unable to prescribe steroids given patient's last A1c was very elevated.  Patient has not had A1c checked in over a year so will avoid prednisone for this reason as well.  IM Toradol administered in urgent care.  Patient advised to not take any additional NSAIDs for at least 24 hours.  May supplement with Tylenol.  Will change muscle relaxer from Zanaflex to Flexeril to see if this provides any additional relief.  Advised patient to not take muscle relaxers together and to discontinue zanaflex as this can cause adverse effects and increased drowsiness.  Patient voiced understanding.  Patient to alternate ice and heat to affected area as well.  Discussed return precautions.  Patient verbalized understanding and was agreeable with plan. Final Clinical Impressions(s) / UC Diagnoses   Final diagnoses:  Chronic right-sided low back pain with right-sided sciatica     Discharge Instructions      It appears that you are having low back pain with sciatica.  You were given an injection urgent care today to decrease pain and inflammation.  Please avoid  taking any ibuprofen, Advil, Aleve for at least 24 hours following injection.  You have also been prescribed a different muscle relaxer.  Please do not take this muscle relaxer that you were previously prescribed in addition to this muscle relaxer as it can cause bad effects.  Please be advised that this muscle relaxer can also cause drowsiness so do not drive while taking this medication.  Alternate ice and heat to affected area.  Follow-up with orthopedist at provided contact information if pain persists or worsens.    ED Prescriptions     Medication Sig Dispense Auth. Provider   cyclobenzaprine (FLEXERIL) 5 MG tablet Take 1 tablet (5 mg total) by mouth 2 (two) times daily as needed for muscle spasms. 20 tablet [place], [place] [address], [place]      I have reviewed the PDMP during this encounter.   Gustavus Bryant, [place] 02/04/22 1124

## 2022-04-27 ENCOUNTER — Ambulatory Visit (HOSPITAL_COMMUNITY)
Admission: EM | Admit: 2022-04-27 | Discharge: 2022-04-27 | Disposition: A | Discharge status: Home / Self Care | Payer: Medicaid Other | Attending: Physician Assistant | Admitting: Physician Assistant

## 2022-04-27 ENCOUNTER — Encounter (HOSPITAL_COMMUNITY): Payer: Self-pay

## 2022-04-27 DIAGNOSIS — M5431 Sciatica, right side: Secondary | ICD-10-CM

## 2022-04-27 MED ORDER — METHYLPREDNISOLONE SODIUM SUCC 125 MG IJ SOLR
INTRAMUSCULAR | Status: AC
  Filled 2022-04-27: qty 2

## 2022-04-27 MED ORDER — PREDNISONE 50 MG PO TABS: ORAL_TABLET | ORAL | 0 refills | Status: DC

## 2022-04-27 MED ORDER — KETOROLAC TROMETHAMINE 60 MG/2ML IM SOLN
60.0000 mg | Freq: Once | INTRAMUSCULAR | Status: AC
  Administered 2022-04-27: 60 mg via INTRAMUSCULAR

## 2022-04-27 MED ORDER — KETOROLAC TROMETHAMINE 60 MG/2ML IM SOLN
INTRAMUSCULAR | Status: AC
  Filled 2022-04-27: qty 2

## 2022-04-27 MED ORDER — METHYLPREDNISOLONE SODIUM SUCC 125 MG IJ SOLR
125.0000 mg | Freq: Once | INTRAMUSCULAR | Status: AC
  Administered 2022-04-27: 125 mg via INTRAMUSCULAR
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
## 2022-04-27 NOTE — ED Provider Notes (Signed)
[facility]    CSN: [id number] Arrival date & time: 04/27/22  3500      History   Chief Complaint Chief Complaint  Patient presents with   Sciatica    HPI Robert Burton is a 36 y.o. male.   Pt complains of sciatic pain.  Pt report he has previously had sciatica.  Pt reports he has been her and had injection for the same. Pt denies any back pain.  No mumbness or tingling   The history is provided by the patient. No language interpreter was used.    Past Medical History:  Diagnosis Date   Diabetes mellitus    G6PD deficiency    Sleep apnea     Patient Active Problem List   Diagnosis Date Noted   Diabetes mellitus ([place]) 10/15/2020   Allergic rhinitis 10/15/2020   Depression 10/15/2020   G6PD deficiency 10/15/2020   GERD (gastroesophageal reflux disease) 10/15/2020   Low back pain 10/15/2020    Past Surgical History:  Procedure Laterality Date   TONSILLECTOMY         Home Medications    Prior to Admission medications   Medication Sig Start Date End Date Taking? Authorizing Provider  acetaminophen (TYLENOL) 325 MG tablet Take 2 tablets (650 mg total) by mouth every 6 (six) hours as needed. 07/17/21  Yes Jaynee Eagles, PA-C  albuterol (PROAIR HFA) 108 (90 Base) MCG/ACT inhaler Inhale 1-2 puffs into the lungs every 6 (six) hours as needed for wheezing or shortness of breath. 06/13/20  Yes Burky, Lanelle Bal B, NP  benzonatate (TESSALON) 100 MG capsule Take 1-2 capsules (100-200 mg total) by mouth 3 (three) times daily as needed for cough. 07/17/21  Yes Jaynee Eagles, PA-C  cetirizine (ZYRTEC ALLERGY) 10 MG tablet Take 1 tablet (10 mg total) by mouth daily. 07/17/21  Yes Jaynee Eagles, PA-C  cyclobenzaprine (FLEXERIL) 5 MG tablet Take 1 tablet (5 mg total) by mouth 2 (two) times daily as needed for muscle spasms. 02/04/22  Yes Mound, Hildred Alamin E, FNP  ibuprofen (ADVIL) 600 MG tablet Take 1 tablet (600 mg total) by mouth every 6 (six) hours as needed. 10/28/21  Yes Piontek,  Erin, MD  insulin aspart (NOVOLOG) 100 UNIT/ML injection Inject 15 Units into the skin 3 (three) times daily before meals. 10/15/20  Yes Lacinda Axon, MD  insulin glargine (LANTUS) 100 UNIT/ML injection Inject 0.3 mLs (30 Units total) into the skin at bedtime. 10/15/20  Yes Amponsah, Charisse March, MD  NON FORMULARY    Yes [provider]  ondansetron (ZOFRAN ODT) 4 MG disintegrating tablet Take 1 tablet (4 mg total) by mouth every 8 (eight) hours as needed for nausea or vomiting. 02/03/21  Yes Lamptey, Myrene Galas, MD  predniSONE (DELTASONE) 50 MG tablet One tablet a day for 5 days 04/27/22  Yes Caryl Ada K, PA-C  gabapentin (NEURONTIN) 300 MG capsule Take 1 capsule (300 mg total) by mouth 2 (two) times daily. 10/15/20 12/14/20  Lacinda Axon, MD  metFORMIN (GLUCOPHAGE) 500 MG tablet Take 1 tablet (500 mg total) by mouth daily with breakfast for 7 days, THEN 1 tablet (500 mg total) 2 (two) times daily with a meal for 7 days. 10/15/20 10/29/20  Lacinda Axon, MD  pantoprazole (PROTONIX) 20 MG tablet Take 1 tablet (20 mg total) by mouth daily. 02/03/21 03/05/21  Chase Picket, MD  promethazine-dextromethorphan (PROMETHAZINE-DM) 6.25-15 MG/5ML syrup Take 5 mLs by mouth at bedtime as needed for cough. 07/17/21   Jaynee Eagles,  PA-C    Family History Family History  Problem Relation Age of Onset   Diabetes Mother    Asthma Mother     Social History Social History   Tobacco Use   Smoking status: Every Day    Packs/day: 0.10    Types: Cigarettes   Smokeless tobacco: Never   Tobacco comments:    2 cigs per day  Vaping Use   Vaping Use: Never used  Substance Use Topics   Alcohol use: Yes    Comment: 2x/week   Drug use: Yes    Types: Marijuana     Allergies   Other   Review of Systems Review of Systems  Musculoskeletal:  Positive for back pain.  All other systems reviewed and are negative.    Physical Exam Triage Vital Signs ED Triage Vitals  Enc Vitals Group      BP 04/27/22 1050 (!) 131/91     Pulse Rate 04/27/22 1050 (!) 102     Resp 04/27/22 1050 16     Temp 04/27/22 1050 97.6 F (36.4 C)     Temp Source 04/27/22 1050 Oral     SpO2 04/27/22 1050 99 %     Weight --      Height --      Head Circumference --      Peak Flow --      Pain Score 04/27/22 1049 10     Pain Loc --      Pain Edu? --      Excl. in [place]? --    No data found.  Updated Vital Signs BP (!) 131/91 (BP Location: Right Arm)   Pulse (!) 102   Temp 97.6 F (36.4 C) (Oral)   Resp 16   SpO2 99%   Visual Acuity Right Eye Distance:   Left Eye Distance:   Bilateral Distance:    Right Eye Near:   Left Eye Near:    Bilateral Near:     Physical Exam Vitals and nursing note reviewed.  Constitutional:      Appearance: He is well-developed.  HENT:     Head: Normocephalic.  Cardiovascular:     Rate and Rhythm: Normal rate.  Pulmonary:     Effort: Pulmonary effort is normal.  Abdominal:     General: There is no distension.  Musculoskeletal:        General: Tenderness present. Normal range of motion.     Cervical back: Normal range of motion.  Skin:    General: Skin is warm.  Neurological:     General: No focal deficit present.     Mental Status: He is alert and oriented to person, place, and time.  Psychiatric:        Mood and Affect: Mood normal.      UC Treatments / Results  Labs (all labs ordered are listed, but only abnormal results are displayed) Labs Reviewed - No data to display  EKG   Radiology No results found.  Procedures Procedures (including critical care time)  Medications Ordered in UC Medications  methylPREDNISolone sodium succinate (SOLU-MEDROL) 125 mg/2 mL injection 125 mg (125 mg Intramuscular Given 04/27/22 1144)  ketorolac (TORADOL) injection 60 mg (60 mg Intramuscular Given 04/27/22 1144)    Initial Impression / Assessment and Plan / UC Course  I have reviewed the triage vital signs and the nursing notes.  Pertinent  labs & imaging results that were available during my care of the patient were reviewed by me and considered  in my medical decision making (see chart for details).     MDM:  Pt given solumedrol and toradol.  Rx for prednisone  Final Clinical Impressions(s) / UC Diagnoses   Final diagnoses:  Sciatica, right side   Discharge Instructions   None    ED Prescriptions     Medication Sig Dispense Auth. Provider   predniSONE (DELTASONE) 50 MG tablet One tablet a day for 5 days 5 tablet Fransico Meadow, [place]      PDMP not reviewed this encounter. An After Visit Summary was printed and given to the patient.    Fransico Meadow, [place] 04/27/22 1150

## 2022-04-29 ENCOUNTER — Encounter (HOSPITAL_BASED_OUTPATIENT_CLINIC_OR_DEPARTMENT_OTHER): Payer: Self-pay | Admitting: Emergency Medicine

## 2022-04-29 ENCOUNTER — Other Ambulatory Visit: Payer: Self-pay

## 2022-04-29 ENCOUNTER — Emergency Department (HOSPITAL_BASED_OUTPATIENT_CLINIC_OR_DEPARTMENT_OTHER)
Admission: EM | Admit: 2022-04-29 | Discharge: 2022-04-29 | Disposition: A | Discharge status: Home / Self Care | Payer: Self-pay | Attending: Emergency Medicine | Admitting: Emergency Medicine

## 2022-04-29 ENCOUNTER — Emergency Department (HOSPITAL_BASED_OUTPATIENT_CLINIC_OR_DEPARTMENT_OTHER): Payer: Self-pay

## 2022-04-29 DIAGNOSIS — M79604 Pain in right leg: Secondary | ICD-10-CM | POA: Insufficient documentation

## 2022-04-29 DIAGNOSIS — Z7984 Long term (current) use of oral hypoglycemic drugs: Secondary | ICD-10-CM | POA: Insufficient documentation

## 2022-04-29 DIAGNOSIS — Z794 Long term (current) use of insulin: Secondary | ICD-10-CM | POA: Insufficient documentation

## 2022-04-29 DIAGNOSIS — M5431 Sciatica, right side: Secondary | ICD-10-CM | POA: Insufficient documentation

## 2022-04-29 DIAGNOSIS — R739 Hyperglycemia, unspecified: Secondary | ICD-10-CM

## 2022-04-29 DIAGNOSIS — E1165 Type 2 diabetes mellitus with hyperglycemia: Secondary | ICD-10-CM | POA: Insufficient documentation

## 2022-04-29 LAB — CBC
HCT: 41.5 % (ref 39.0–52.0)
Hemoglobin: 14.4 g/dL (ref 13.0–17.0)
MCH: 30.7 pg (ref 26.0–34.0)
MCHC: 34.7 g/dL (ref 30.0–36.0)
MCV: 88.5 fL (ref 80.0–100.0)
Platelets: 379 10*3/uL (ref 150–400)
RBC: 4.69 MIL/uL (ref 4.22–5.81)
RDW: 11.3 % — ABNORMAL LOW (ref 11.5–15.5)
WBC: 9.4 10*3/uL (ref 4.0–10.5)
nRBC: 0 % (ref 0.0–0.2)

## 2022-04-29 LAB — URINALYSIS, ROUTINE W REFLEX MICROSCOPIC
Bilirubin Urine: NEGATIVE
Glucose, UA: >1000 mg/dL — AB
Hgb urine dipstick: NEGATIVE
Leukocytes,Ua: NEGATIVE
Nitrite: NEGATIVE
Protein, ur: NEGATIVE mg/dL
Specific Gravity, Urine: 1.031 — ABNORMAL HIGH (ref 1.005–1.030)
pH: 6 (ref 5.0–8.0)

## 2022-04-29 LAB — BASIC METABOLIC PANEL
Anion gap: 12 (ref 5–15)
BUN: 18 mg/dL (ref 6–20)
CO2: 25 mmol/L (ref 22–32)
Calcium: 9.5 mg/dL (ref 8.9–10.3)
Chloride: 91 mmol/L — ABNORMAL LOW (ref 98–111)
Creatinine, Ser: 1.06 mg/dL (ref 0.61–1.24)
GFR, Estimated: >60 mL/min (ref >60)
Glucose, Bld: 502 mg/dL (ref 70–99)
Potassium: 4.4 mmol/L (ref 3.5–5.1)
Sodium: 128 mmol/L — ABNORMAL LOW (ref 135–145)

## 2022-04-29 LAB — CBG MONITORING, ED
Glucose-Capillary: 487 mg/dL — ABNORMAL HIGH (ref 70–99)
Glucose-Capillary: 361 mg/dL — ABNORMAL HIGH (ref 70–99)
Glucose-Capillary: 312 mg/dL — ABNORMAL HIGH (ref 70–99)

## 2022-04-29 MED ORDER — INSULIN REGULAR HUMAN 100 UNIT/ML IJ SOLN: 10.0000 [IU] | Freq: Once | INTRAMUSCULAR | Status: DC

## 2022-04-29 MED ORDER — INSULIN ASPART 100 UNIT/ML IJ SOLN
10.0000 [IU] | Freq: Once | INTRAMUSCULAR | Status: AC
  Administered 2022-04-29: 10 [IU] via SUBCUTANEOUS

## 2022-04-29 MED ORDER — HYDROMORPHONE HCL 1 MG/ML IJ SOLN
0.5000 mg | Freq: Once | INTRAMUSCULAR | Status: AC
  Administered 2022-04-29: 0.5 mg via INTRAVENOUS
  Filled 2022-04-29: qty 1

## 2022-04-29 MED ORDER — LACTATED RINGERS IV BOLUS
1500.0000 mL | Freq: Once | INTRAVENOUS | Status: AC
  Administered 2022-04-29: 1500 mL via INTRAVENOUS

## 2022-04-29 MED ORDER — METHOCARBAMOL 500 MG PO TABS: 500.0000 mg | ORAL_TABLET | Freq: Two times a day (BID) | ORAL | 0 refills | Status: DC

## 2022-04-29 NOTE — ED Notes (Signed)
CRITICAL VALUE STICKER  CRITICAL VALUE:Glucose 335  OIPPGFQM (on-site recipient of call):Shawnie Pons, RN  DATE & TIME NOTIFIED:   MESSENGER (representative from lab):  MD NOTIFIED: P.A. [place]  TIME OF NOTIFICATION:1308  RESPONSE:

## 2022-04-29 NOTE — ED Notes (Signed)
As I write this, our tech. Is beginning her vascular study. Pt. Remains in no distress.

## 2022-04-29 NOTE — ED Provider Notes (Incomplete)
[facility] EMERGENCY DEPT Provider Note   CSN: [id number] Arrival date & time: 04/29/22  1050     History {Add pertinent medical, surgical, social history, OB history to HPI:1} Chief Complaint  Patient presents with  . Leg Pain    Robert Burton is a 36 y.o. male.   Leg Pain Robert Burton is a 36 yo male type 2 diabetic presenting with right leg pain. Onset of pain was 5 days ago. He has a history of sciatica and states the pain originally felt like previous sciatic pain but is now worse and has new onset of numbness and tingling in the right foot. Pain is worse with walking. Pt was seen in UC two days ago and was given an injection of solumedrol and toradol and a Rx of prednisone with no relief.       Home Medications Prior to Admission medications   Medication Sig Start Date End Date Taking? Authorizing Provider  acetaminophen (TYLENOL) 325 MG tablet Take 2 tablets (650 mg total) by mouth every 6 (six) hours as needed. 07/17/21   Wallis Bamberg, PA-C  albuterol [facility] HFA) [phone] Base) MCG/ACT inhaler Inhale 1-2 puffs into the lungs every 6 (six) hours as needed for wheezing or shortness of breath. 06/13/20   Georgetta Haber, NP  benzonatate (TESSALON) 100 MG capsule Take 1-2 capsules (100-200 mg total) by mouth 3 (three) times daily as needed for cough. 07/17/21   Wallis Bamberg, PA-C  cetirizine (ZYRTEC ALLERGY) 10 MG tablet Take 1 tablet (10 mg total) by mouth daily. 07/17/21   Wallis Bamberg, PA-C  cyclobenzaprine (FLEXERIL) 5 MG tablet Take 1 tablet (5 mg total) by mouth 2 (two) times daily as needed for muscle spasms. 02/04/22   Gustavus Bryant, FNP  gabapentin (NEURONTIN) 300 MG capsule Take 1 capsule (300 mg total) by mouth 2 (two) times daily. 10/15/20 12/14/20  Steffanie Rainwater, MD  ibuprofen (ADVIL) 600 MG tablet Take 1 tablet (600 mg total) by mouth every 6 (six) hours as needed. 10/28/21   Piontek, Denny Peon, MD  insulin aspart (NOVOLOG) 100 UNIT/ML injection Inject 15  Units into the skin 3 (three) times daily before meals. 10/15/20   Steffanie Rainwater, MD  insulin glargine (LANTUS) 100 UNIT/ML injection Inject 0.3 mLs (30 Units total) into the skin at bedtime. 10/15/20   Steffanie Rainwater, MD  metFORMIN (GLUCOPHAGE) 500 MG tablet Take 1 tablet (500 mg total) by mouth daily with breakfast for 7 days, THEN 1 tablet (500 mg total) 2 (two) times daily with a meal for 7 days. 10/15/20 10/29/20  Steffanie Rainwater, MD  NON FORMULARY     [provider]  ondansetron (ZOFRAN ODT) 4 MG disintegrating tablet Take 1 tablet (4 mg total) by mouth every 8 (eight) hours as needed for nausea or vomiting. 02/03/21   Lamptey, Britta Mccreedy, MD  pantoprazole (PROTONIX) 20 MG tablet Take 1 tablet (20 mg total) by mouth daily. 02/03/21 03/05/21  Merrilee Jansky, MD  predniSONE (DELTASONE) 50 MG tablet One tablet a day for 5 days 04/27/22   Elson Areas, PA-C  promethazine-dextromethorphan (PROMETHAZINE-DM) 6.25-15 MG/5ML syrup Take 5 mLs by mouth at bedtime as needed for cough. 07/17/21   Wallis Bamberg, PA-C      Allergies    Other    Review of Systems   Review of Systems  Physical Exam Updated Vital Signs BP 131/77   Pulse (!) 101   Temp 97.6 F (36.4 C)  Resp 18   SpO2 96%  Physical Exam  ED Results / Procedures / Treatments   Labs (all labs ordered are listed, but only abnormal results are displayed) Labs Reviewed  BASIC METABOLIC PANEL - Abnormal; Notable for the following components:      Result Value   Sodium 128 (*)    Chloride 91 (*)    Glucose, Bld 502 (*)    All other components within normal limits  CBC - Abnormal; Notable for the following components:   RDW 11.3 (*)    All other components within normal limits  CBG MONITORING, ED - Abnormal; Notable for the following components:   Glucose-Capillary 487 (*)    All other components within normal limits  URINALYSIS, ROUTINE W REFLEX MICROSCOPIC    EKG None  Radiology US Venous Img Lower  Unilateral Right  Result Date: 04/29/2022 CLINICAL DATA:  Calf pain EXAM: RIGHT LOWER EXTREMITY VENOUS DOPPLER ULTRASOUND TECHNIQUE: Gray-scale sonography with compression, as well as color and duplex ultrasound, were performed to evaluate the deep venous system(s) from the level of the common femoral vein through the popliteal and proximal calf veins. COMPARISON:  None Available. FINDINGS: VENOUS Normal compressibility of the common femoral, superficial femoral, and popliteal veins, as well as the visualized calf veins. Visualized portions of profunda femoral vein and great saphenous vein unremarkable. No filling defects to suggest DVT on grayscale or color Doppler imaging. Doppler waveforms show normal direction of venous flow, normal respiratory plasticity and response to augmentation. Limited views of the contralateral common femoral vein are unremarkable. OTHER None. Limitations: none IMPRESSION: Negative. Electronically Signed   By: Corlis Leak M.D.   On: 04/29/2022 12:36    Procedures Procedures  {Document cardiac monitor, telemetry assessment procedure when appropriate:1}  Medications Ordered in ED Medications  lactated ringers bolus 1,500 mL (has no administration in time range)  insulin regular (NOVOLIN R) 100 units/mL injection 10 Units (has no administration in time range)  HYDROmorphone (DILAUDID) injection 0.5 mg (has no administration in time range)    ED Course/ Medical Decision Making/ A&P Clinical Course as of 04/29/22 1420  Sat Apr 29, 2022  1323 5 days of pain,hx of similar.  [WF]    Clinical Course User Index [WF] Gailen Shelter, [place]                           Medical Decision Making Amount and/or Complexity of Data Reviewed Labs: ordered.  Risk OTC drugs. Prescription drug management.   This patient presents to the ED for concern of R leg pain, polyuria, this involves a number of treatment options, and is a complaint that carries with it a moderate to high risk  of complications and morbidity. A differential diagnosis was considered for the patient's symptoms which is discussed below:     Co morbidities: Discussed in HPI   Brief History:  Robert Burton is a 36 yo male type 2 diabetic presenting with right leg pain. Onset of pain was 5 days ago. He has a history of sciatica and states the pain originally felt like previous sciatic pain but is now worse and has new onset of numbness and tingling in the right foot. Pain is worse with walking. Pt was seen in UC two days ago and was given an injection of solumedrol and toradol and a Rx of prednisone with no relief.     EMR reviewed including pt PMHx, past surgical history and past visits  to ER.   See HPI for more details   Lab Tests:   {Blank single:19197::"I ordered and independently interpreted labs. Labs notable for","I personally reviewed all laboratory work and imaging. Metabolic panel without any acute abnormality specifically kidney function within normal limits and no significant electrolyte abnormalities. CBC without leukocytosis or significant anemia."}   Imaging Studies:  {Blank single:19197::"NAD. I personally reviewed all imaging studies and no acute abnormality found. I agree with radiology interpretation.","Abnormal findings. I personally reviewed all imaging studies. Imaging notable for","No imaging studies ordered for this patient"}    Cardiac Monitoring:  .{Blank single:19197::"The patient was maintained on a cardiac monitor.  I personally viewed and interpreted the cardiac monitored which showed an underlying rhythm of:","NA"} .{Blank single:19197::"EKG non-ischemic","NA"}   Medicines ordered:  I ordered medication including ***  for *** Reevaluation of the patient after these medicines showed that the patient {resolved/improved/worsened:23923::"improved"} I have reviewed the patients home medicines and have made adjustments as needed   Critical  Interventions:  .***   Consults/Attending Physician   .{Blank single:19197::"I requested consultation with ***,  and discussed lab and imaging findings as well as pertinent plan - they recommend: ***","I discussed this case with my attending physician who cosigned this note including patient's presenting symptoms, physical exam, and planned diagnostics and interventions. Attending physician stated agreement with plan or made changes to plan which were implemented."}   Reevaluation:  After the interventions noted above I re-evaluated patient and found that they have :{resolved/improved/worsened:23923::"improved"}   Social Determinants of Health:  Marland Kitchen{Blank single:19197::"Given cab voucher","Social work/case management involved","The patient's social determinants of health were a factor in the care of this patient"}    Problem List / ED Course:  ***   Dispostion:  After consideration of the diagnostic results and the patients response to treatment, I feel that the patent would benefit from ***       Final Clinical Impression(s) / ED Diagnoses Final diagnoses:  None    Rx / DC Orders ED Discharge Orders     None

## 2022-04-29 NOTE — ED Triage Notes (Signed)
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 About 5 days ago, started hurting, felt like his normal pain, but has progressed to his right calf hurting and feels some knots. Pt states he cant fell his right foot, foot is warm and pulses positive in triage.

## 2022-04-29 NOTE — ED Triage Notes (Signed)
Pt states his blood sugars have been in the 300's for about a month, has been taking his meds as ordered.

## 2022-04-29 NOTE — Discharge Instructions (Addendum)
Your work-up today was reassuring apart from your significantly elevated blood sugars.  Please make sure you are using the sliding scale insulin instructions to make sure that you are keeping her blood sugars under control this will likely help with your nerve related pain.  Please continue taking gabapentin as prescribed.  Alternate Tylenol and ibuprofen as discussed below and use Robaxin as a muscle relaxer instead of the Flexeril I see in your chart.  Stop using Flexeril as this is not a very effective medication. Make sure that you do not sit on uneven surfaces or keep a wallet or phone in your back pocket  Please use Tylenol or ibuprofen for pain.  You may use 600 mg ibuprofen every 6 hours or 1000 mg of Tylenol every 6 hours.  You may choose to alternate between the 2.  This would be most effective.  Not to exceed 4 g of Tylenol within 24 hours.  Not to exceed 3200 mg ibuprofen 24 hours.

## 2022-05-02 ENCOUNTER — Encounter (HOSPITAL_COMMUNITY): Payer: Self-pay | Admitting: Emergency Medicine

## 2022-05-02 ENCOUNTER — Emergency Department (HOSPITAL_COMMUNITY): Payer: Self-pay

## 2022-05-02 ENCOUNTER — Emergency Department (HOSPITAL_COMMUNITY)
Admission: EM | Admit: 2022-05-02 | Discharge: 2022-05-02 | Disposition: A | Discharge status: Home / Self Care | Payer: Self-pay | Attending: Emergency Medicine | Admitting: Emergency Medicine

## 2022-05-02 ENCOUNTER — Other Ambulatory Visit (HOSPITAL_COMMUNITY): Payer: Self-pay

## 2022-05-02 ENCOUNTER — Other Ambulatory Visit: Payer: Self-pay

## 2022-05-02 DIAGNOSIS — M5441 Lumbago with sciatica, right side: Secondary | ICD-10-CM | POA: Insufficient documentation

## 2022-05-02 DIAGNOSIS — Z794 Long term (current) use of insulin: Secondary | ICD-10-CM

## 2022-05-02 DIAGNOSIS — E878 Other disorders of electrolyte and fluid balance, not elsewhere classified: Secondary | ICD-10-CM | POA: Insufficient documentation

## 2022-05-02 DIAGNOSIS — E871 Hypo-osmolality and hyponatremia: Secondary | ICD-10-CM | POA: Insufficient documentation

## 2022-05-02 DIAGNOSIS — R000 Tachycardia, unspecified: Secondary | ICD-10-CM | POA: Insufficient documentation

## 2022-05-02 DIAGNOSIS — R7309 Other abnormal glucose: Secondary | ICD-10-CM | POA: Insufficient documentation

## 2022-05-02 LAB — CBC
HCT: 45.3 % (ref 39.0–52.0)
Hemoglobin: 15.4 g/dL (ref 13.0–17.0)
MCH: 30.1 pg (ref 26.0–34.0)
MCHC: 34 g/dL (ref 30.0–36.0)
MCV: 88.5 fL (ref 80.0–100.0)
Platelets: 403 10*3/uL — ABNORMAL HIGH (ref 150–400)
RBC: 5.12 MIL/uL (ref 4.22–5.81)
RDW: 11.3 % — ABNORMAL LOW (ref 11.5–15.5)
WBC: 9.3 10*3/uL (ref 4.0–10.5)
nRBC: 0 % (ref 0.0–0.2)

## 2022-05-02 LAB — COMPREHENSIVE METABOLIC PANEL
ALT: 23 U/L (ref 0–44)
AST: 20 U/L (ref 15–41)
Albumin: 4.1 g/dL (ref 3.5–5.0)
Alkaline Phosphatase: 58 U/L (ref 38–126)
Anion gap: 13 (ref 5–15)
BUN: 17 mg/dL (ref 6–20)
CO2: 24 mmol/L (ref 22–32)
Calcium: 9.3 mg/dL (ref 8.9–10.3)
Chloride: 92 mmol/L — ABNORMAL LOW (ref 98–111)
Creatinine, Ser: 0.87 mg/dL (ref 0.61–1.24)
GFR, Estimated: >60 mL/min (ref >60)
Glucose, Bld: 458 mg/dL — ABNORMAL HIGH (ref 70–99)
Potassium: 4.4 mmol/L (ref 3.5–5.1)
Sodium: 129 mmol/L — ABNORMAL LOW (ref 135–145)
Total Bilirubin: 0.6 mg/dL (ref 0.3–1.2)
Total Protein: 8.1 g/dL (ref 6.5–8.1)

## 2022-05-02 LAB — URINALYSIS, ROUTINE W REFLEX MICROSCOPIC
Bacteria, UA: NONE SEEN
Bilirubin Urine: NEGATIVE
Glucose, UA: >=500 mg/dL — AB
Hgb urine dipstick: NEGATIVE
Ketones, ur: NEGATIVE mg/dL
Leukocytes,Ua: NEGATIVE
Nitrite: NEGATIVE
Protein, ur: NEGATIVE mg/dL
Specific Gravity, Urine: 1.033 — ABNORMAL HIGH (ref 1.005–1.030)
pH: 6 (ref 5.0–8.0)

## 2022-05-02 LAB — CBG MONITORING, ED: Glucose-Capillary: 339 mg/dL — ABNORMAL HIGH (ref 70–99)

## 2022-05-02 MED ORDER — LIDOCAINE 5 % EX PTCH
1.0000 | MEDICATED_PATCH | CUTANEOUS | 0 refills | Status: DC
  Filled 2022-05-02: qty 30, 30d supply, fill #0

## 2022-05-02 MED ORDER — NAPROXEN 500 MG PO TABS
500.0000 mg | ORAL_TABLET | Freq: Two times a day (BID) | ORAL | 0 refills | Status: AC
  Filled 2022-05-02: qty 14, 7d supply, fill #0

## 2022-05-02 MED ORDER — SODIUM CHLORIDE 0.9 % IV BOLUS
1000.0000 mL | Freq: Once | INTRAVENOUS | Status: AC
  Administered 2022-05-02: 1000 mL via INTRAVENOUS

## 2022-05-02 MED ORDER — INSULIN ASPART 100 UNIT/ML IJ SOLN
15.0000 [IU] | Freq: Three times a day (TID) | INTRAMUSCULAR | 0 refills | Status: AC
  Filled 2022-05-02: qty 10, 22d supply, fill #0

## 2022-05-02 MED ORDER — INSULIN ASPART 100 UNIT/ML IJ SOLN
5.0000 [IU] | Freq: Once | INTRAMUSCULAR | Status: AC
  Administered 2022-05-02: 5 [IU] via SUBCUTANEOUS
  Filled 2022-05-02: qty 0.05

## 2022-05-02 MED ORDER — GABAPENTIN 300 MG PO CAPS
600.0000 mg | ORAL_CAPSULE | Freq: Two times a day (BID) | ORAL | 0 refills | Status: AC
  Filled 2022-05-02: qty 28, 7d supply, fill #0

## 2022-05-02 MED ORDER — MORPHINE SULFATE (PF) 4 MG/ML IV SOLN
4.0000 mg | Freq: Once | INTRAVENOUS | Status: AC
  Administered 2022-05-02: 4 mg via INTRAVENOUS
  Filled 2022-05-02: qty 1

## 2022-05-02 MED ORDER — INSULIN GLARGINE 100 UNIT/ML ~~LOC~~ SOLN
30.0000 [IU] | Freq: Every day | SUBCUTANEOUS | 0 refills | Status: AC
  Filled 2022-05-02: qty 10, 28d supply, fill #0

## 2022-05-02 NOTE — Discharge Instructions (Addendum)
You were seen in the ER regarding your lower back pain and right-sided sciatica.  I have sent you home with prescriptions to help manage the pain including naproxen, lidocaine patches, and gabapentin.  Please take medications as prescribed.  You may also continue to use Robaxin for muscle spasms.  I have attached information about sciatica with at home treatments and rehab you can do.  Transitions of care was able to get you medication assistance and an appointment with a primary care physician.  Your medications, including insulin, were sent to [facility].  It is crucial you follow-up with PCP on date listed as part of your ER follow-up and to continue receiving care.   Return to ER if you develop any new or worsening symptoms including fever >100.4, worsening numbness that includes your groin area, loss of bowel or bladder control, or weakness of your legs.

## 2022-05-02 NOTE — ED Triage Notes (Signed)
Pt in via [facility] with R sciatica pain, ongoing x 2 wks. Currently on muscle relaxer and steroid for pain, but states pain is worse, and now radiating to R groin. Unable to bear weight on RLE

## 2022-05-02 NOTE — ED Provider Notes (Signed)
[facility] DEPT Provider Note   CSN: [id number] Arrival date & time: 05/02/22  [id number]     History  Chief Complaint  Patient presents with   Sciatica    Robert Burton is a 36 y.o. male who presents to the ER via EMS complaining of right sciatica pain that has progressively been worsening.  He was seen in urgent care and ER for same symptoms over the last few days.  He states that the numbness and tingling in his right leg has worsened and he can no longer feel the lower part of his leg or his right foot.  He also states the pain has been so severe he has been unable to get up and move around as normal.  Has been taking prescribed Robaxin and prednisone.  Patient has been out of his insulin for the past 2 weeks and has been having elevated sugars and polyuria.  He states he now has pain radiating to the right groin is unable to bear weight on RLE.  Denies headache, lightheadedness, dizziness, saddle anesthesia, loss of bowel or bladder control, fevers, chills.  Denies falls, heavy lifting, or traumatic injury.  HPI     Home Medications Prior to Admission medications   Medication Sig Start Date End Date Taking? Authorizing Provider  lidocaine (LIDODERM) 5 % Place 1 patch onto the skin daily. Remove & Discard patch within 12 hours or as directed by MD 05/02/22  Yes Carol Theys R, PA  naproxen (NAPROSYN) 500 MG tablet Take 1 tablet (500 mg total) by mouth 2 (two) times daily for 7 days. 05/02/22 05/09/22 Yes Shalana Jardin R, PA  acetaminophen (TYLENOL) 325 MG tablet Take 2 tablets (650 mg total) by mouth every 6 (six) hours as needed. 07/17/21   Jaynee Eagles, PA-C  albuterol [facility] HFA) [phone] Base) MCG/ACT inhaler Inhale 1-2 puffs into the lungs every 6 (six) hours as needed for wheezing or shortness of breath. 06/13/20   Zigmund Gottron, NP  benzonatate (TESSALON) 100 MG capsule Take 1-2 capsules (100-200 mg total) by mouth 3 (three) times daily as needed for  cough. 07/17/21   Jaynee Eagles, PA-C  cetirizine (ZYRTEC ALLERGY) 10 MG tablet Take 1 tablet (10 mg total) by mouth daily. 07/17/21   Jaynee Eagles, PA-C  gabapentin (NEURONTIN) 300 MG capsule Take 2 capsules (600 mg total) by mouth 2 (two) times daily for 7 days. 05/02/22 05/09/22  Alexcis Bicking R, PA  ibuprofen (ADVIL) 600 MG tablet Take 1 tablet (600 mg total) by mouth every 6 (six) hours as needed. 10/28/21   Piontek, Junie Panning, MD  insulin aspart (NOVOLOG) 100 UNIT/ML injection Inject 15 units into the skin 3 (three) times daily before meals. 05/02/22   Hezikiah Retzloff R, PA  insulin glargine (LANTUS) 100 UNIT/ML injection Inject 0.3 mLs (30 Units total) into the skin at bedtime. 05/02/22   Damira Kem R, PA  metFORMIN (GLUCOPHAGE) 500 MG tablet Take 1 tablet (500 mg total) by mouth daily with breakfast for 7 days, THEN 1 tablet (500 mg total) 2 (two) times daily with a meal for 7 days. 10/15/20 10/29/20  Lacinda Axon, MD  methocarbamol (ROBAXIN) 500 MG tablet Take 1 tablet (500 mg total) by mouth 2 (two) times daily. 04/29/22   Tedd Sias, PA  NON FORMULARY     [provider]  ondansetron (ZOFRAN ODT) 4 MG disintegrating tablet Take 1 tablet (4 mg total) by mouth every 8 (eight) hours as needed for nausea  or vomiting. 02/03/21   Lamptey, Myrene Galas, MD  pantoprazole (PROTONIX) 20 MG tablet Take 1 tablet (20 mg total) by mouth daily. 02/03/21 03/05/21  Chase Picket, MD  promethazine-dextromethorphan (PROMETHAZINE-DM) 6.25-15 MG/5ML syrup Take 5 mLs by mouth at bedtime as needed for cough. 07/17/21   Jaynee Eagles, PA-C      Allergies    Other    Review of Systems   Review of Systems  Constitutional:  Negative for chills and fever.  Cardiovascular:  Negative for palpitations.  Gastrointestinal:  Negative for diarrhea, nausea and vomiting.  Endocrine: Positive for polyuria.  Musculoskeletal:  Positive for back pain.  Neurological:  Positive for numbness. Negative for dizziness, syncope,  weakness, light-headedness and headaches.  Psychiatric/Behavioral:  Negative for confusion.     Physical Exam Updated Vital Signs BP (!) 132/98   Pulse 98   Temp 97.9 F (36.6 C) (Oral)   Resp 16   Ht 6\' 1"  (1.854 m)   Wt 117.9 kg   SpO2 100%   BMI 34.30 kg/m  Physical Exam Vitals and nursing note reviewed.  Constitutional:      General: He is not in acute distress.    Appearance: Normal appearance. He is not ill-appearing or diaphoretic.  HENT:     Mouth/Throat:     Mouth: Mucous membranes are moist.     Pharynx: Oropharynx is clear.  Eyes:     Pupils: Pupils are equal, round, and reactive to light.  Cardiovascular:     Rate and Rhythm: Regular rhythm. Tachycardia present.     Pulses: Normal pulses.          Dorsalis pedis pulses are 2+ on the right side and 2+ on the left side.     Heart sounds: Normal heart sounds.  Pulmonary:     Effort: Pulmonary effort is normal.     Breath sounds: Normal breath sounds and air entry.  Abdominal:     General: Bowel sounds are normal.     Palpations: Abdomen is soft. There is no pulsatile mass.     Tenderness: There is no abdominal tenderness.  Musculoskeletal:     Cervical back: Normal and full passive range of motion without pain.     Thoracic back: Normal.     Lumbar back: Tenderness present. No deformity or bony tenderness. Decreased range of motion. Positive right straight leg raise test. Negative left straight leg raise test.     Right lower leg: No edema.     Left lower leg: No edema.  Skin:    General: Skin is warm and dry.     Capillary Refill: Capillary refill takes less than 2 seconds.  Neurological:     Mental Status: He is alert. Mental status is at baseline.     Sensory: Sensory deficit present.     Motor: Motor function is intact.     Coordination: Coordination is intact.     Comments: Reduced sensation to touch of RLE distal to the knee.  Psychiatric:        Mood and Affect: Mood normal.        Behavior:  Behavior normal.     ED Results / Procedures / Treatments   Labs (all labs ordered are listed, but only abnormal results are displayed) Labs Reviewed  COMPREHENSIVE METABOLIC PANEL - Abnormal; Notable for the following components:      Result Value   Sodium 129 (*)    Chloride 92 (*)    Glucose, Bld 458 (*)  All other components within normal limits  CBC - Abnormal; Notable for the following components:   RDW 11.3 (*)    Platelets 403 (*)    All other components within normal limits  URINALYSIS, ROUTINE W REFLEX MICROSCOPIC - Abnormal; Notable for the following components:   Color, Urine STRAW (*)    Specific Gravity, Urine 1.033 (*)    Glucose, UA >=500 (*)    All other components within normal limits  CBG MONITORING, ED - Abnormal; Notable for the following components:   Glucose-Capillary 339 (*)    All other components within normal limits    EKG None  Radiology DG Lumbar Spine Complete  Result Date: 05/02/2022 CLINICAL DATA:  Lumbar back pain EXAM: LUMBAR SPINE - COMPLETE 4 VIEW COMPARISON:  Lumbar spine radiograph dated March 14, 2006 FINDINGS: There is no evidence of lumbar spine fracture. Alignment is normal. Intervertebral disc spaces are maintained. Mild aortoiliac vascular calcifications. IMPRESSION: No acute osseous abnormality. Electronically Signed   By: Yetta Glassman M.D.   On: 05/02/2022 12:58    Procedures Procedures    Medications Ordered in ED Medications  morphine (PF) 4 MG/ML injection 4 mg (4 mg Intravenous Given 05/02/22 1206)  sodium chloride 0.9 % bolus 1,000 mL (0 mLs Intravenous Stopped 05/02/22 1445)  insulin aspart (novoLOG) injection 5 Units (5 Units Subcutaneous Given 05/02/22 1325)    ED Course/ Medical Decision Making/ A&P                           Medical Decision Making Amount and/or Complexity of Data Reviewed Labs: ordered. Decision-making details documented in ED Course. Radiology: ordered and independent  interpretation performed. Decision-making details documented in ED Course. ECG/medicine tests: ordered and independent interpretation performed. Decision-making details documented in ED Course.  Risk Prescription drug management.  Patient presents to the ER with worsening lower back pain and development of new numbness and tingling to the right lower extremity.  He was previously seen in urgent care and ER for similar symptoms, has been taking prescribed Robaxin and prednisone without improvement.  He has also been experiencing elevated blood sugars at home, in the high 300s.  He has been off of insulin for approximately 2 weeks.    Differential diagnoses include but are not limited to disc herniation, sciatica, acute muscle strain, degenerative joint disease, spondylosis, spondylolisthesis, ankylosing spondylitis.  Less suspicious of infectious process due to lack of fever, chills, IV drug use or cauda equina syndrome, lack of saddle anesthesia, loss of bowel or bladder control.  Physical examination reveals positive straight leg raise on right side and decreased sensation to the RLE.  Pulses intact.  Motor function intact, no weakness of lower extremities.  Laboratory work-up was ordered and personally reviewed.  CBC without leukocytosis or anemia.  Metabolic panel significant for sodium 129, chloride 92, glucose 458.  UA significant for elevated specific gravity and glucose >500, no ketones. Anion gap 13.  No evidence of DKA.   Lumbar x-ray ordered which demonstrated no acute osseous abnormality and no disc degeneration.  ECG ordered and interpreted, demonstrates sinus tachycardia with likely early repol pattern. Will manage patient's severe pain with morphine while in ER.  Will give fluid bolus and insulin for hyperglycemia.  Advised patient to discontinue prednisone.  Placed consult with TOC for medication assistance and PCP needs.  Patient has had multiple visits to the ER for back pain.   Patient has no insurance and is unable  to afford his insulin pens and has not followed up with a primary care or specialist provider regarding his back pain.  TOC assisted with medication assistance and PCP appointment.  Prescriptions for Novolog and Lantus sent to [facility] without refills.  Stressed importance of PCP follow-up with patient.   Reassessment of CBG shows improvement - 458 --> 339 following fluids and SQ insulin.  Due to no evidence of DKA and patient BG improving, I feel discharge at this time is appropriate.   Plan to discharge patient with prescription for lidocaine patches, naproxen and gabapentin.  Advised patient to continue using Robaxin.  Discussed discharge plan with patient who agrees.  Return precautions provided.   Discussed HPI, work-up, assessment and plan with attending Charlesetta Shanks.  The attending agrees with plan of discharge.         Final Clinical Impression(s) / ED Diagnoses Final diagnoses:  Acute right-sided low back pain with right-sided sciatica    Rx / DC Orders ED Discharge Orders          Ordered    naproxen (NAPROSYN) 500 MG tablet  2 times daily        05/02/22 1358    lidocaine (LIDODERM) 5 %  Every 24 hours        05/02/22 1358    insulin aspart (NOVOLOG) 100 UNIT/ML injection  3 times daily before meals        05/02/22 1358    insulin glargine (LANTUS) 100 UNIT/ML injection  Daily at bedtime        05/02/22 1358    gabapentin (NEURONTIN) 300 MG capsule  2 times daily        05/02/22 1358              Theressa Stamps [place], [place] 05/02/22 1448    Charlesetta Shanks, MD 05/02/22 1543

## 2022-05-02 NOTE — Discharge Planning (Signed)
RNCM consulted for medication assistance. RNCM reviewed chart and spoke with the pt about [facility] MATCH program ($3 co pay for each Rx through [facility] program, does not include refills, 7 day expiration of [place] letter and choice of pharmacies). Pt is eligible for [facility] MATCH program (unable to find pt listed in PROCARE per cardholder name inquiry) and has agreed to accept [facility] with co-pay override. PROCARE information entered; Rx sent to [facility] to fill and deliver to pt at bedside prior to discharge home today. RNCM updated EDP and ED RN.   RNCM also confirmed that pt does not have PCP. RNCM set-up follow up appointment at [facility] on 11/7 at 1045.  Pt advised to of appointment date, time, address and phone number.

## 2022-05-02 NOTE — Discharge Planning (Signed)
  [place] Medication Assistance Card Name: Eaden Hettinger ID (MRN): [id number] Bin: [id number] RX Group: [id number] Discharge Date: 05/02/2022 Expiration Date:05/10/2022                                           (must be filled within 7 days of discharge)     You have been approved to have the prescriptions written by your discharging physician filled through our [facility] (Medication Assistance Through [facility]) program. This program allows for a one-time (no refills) 34-day supply of selected medications for a low copay amount.  The copay is $0  Only certain pharmacies are participating in this program with [facility]. You will need to select one of the pharmacies from the attached list and take your prescriptions, this letter, and your photo ID to one of the [place] pharmacies, [facility], [facility] [address] or [facility] [address].   We are excited that you are able to use the [facility] program to get your medications. These prescriptions must be filled within 7 days of hospital discharge or they will no longer be valid for the [facility] program. Should you have any problems with your prescriptions please contact your case management team member at [phone] for [place]/ [place] you, [place] Management

## 2022-05-16 ENCOUNTER — Ambulatory Visit: Payer: Medicaid Other | Admitting: Student

## 2022-06-22 ENCOUNTER — Emergency Department (HOSPITAL_BASED_OUTPATIENT_CLINIC_OR_DEPARTMENT_OTHER)
Admission: EM | Admit: 2022-06-22 | Discharge: 2022-06-22 | Disposition: A | Discharge status: Home / Self Care | Payer: Medicaid Other | Attending: Emergency Medicine | Admitting: Emergency Medicine

## 2022-06-22 ENCOUNTER — Other Ambulatory Visit: Payer: Self-pay

## 2022-06-22 ENCOUNTER — Encounter (HOSPITAL_BASED_OUTPATIENT_CLINIC_OR_DEPARTMENT_OTHER): Payer: Self-pay | Admitting: Emergency Medicine

## 2022-06-22 DIAGNOSIS — Z202 Contact with and (suspected) exposure to infections with a predominantly sexual mode of transmission: Secondary | ICD-10-CM | POA: Diagnosis present

## 2022-06-22 DIAGNOSIS — Z794 Long term (current) use of insulin: Secondary | ICD-10-CM | POA: Insufficient documentation

## 2022-06-22 DIAGNOSIS — R824 Acetonuria: Secondary | ICD-10-CM | POA: Diagnosis not present

## 2022-06-22 DIAGNOSIS — E119 Type 2 diabetes mellitus without complications: Secondary | ICD-10-CM | POA: Diagnosis not present

## 2022-06-22 DIAGNOSIS — Z7984 Long term (current) use of oral hypoglycemic drugs: Secondary | ICD-10-CM | POA: Insufficient documentation

## 2022-06-22 LAB — URINALYSIS, ROUTINE W REFLEX MICROSCOPIC
Bilirubin Urine: NEGATIVE
Glucose, UA: >1000 mg/dL — AB
Hgb urine dipstick: NEGATIVE
Ketones, ur: 15 mg/dL — AB
Leukocytes,Ua: NEGATIVE
Nitrite: NEGATIVE
Protein, ur: NEGATIVE mg/dL
Specific Gravity, Urine: 1.03 (ref 1.005–1.030)
pH: 6 (ref 5.0–8.0)

## 2022-06-22 MED ORDER — DOXYCYCLINE HYCLATE 100 MG PO TABS: 100.0000 mg | ORAL_TABLET | Freq: Two times a day (BID) | ORAL | 0 refills | Status: AC

## 2022-06-22 MED ORDER — CEFTRIAXONE SODIUM 500 MG IJ SOLR
500.0000 mg | Freq: Once | INTRAMUSCULAR | Status: AC
  Administered 2022-06-22: 500 mg via INTRAMUSCULAR
  Filled 2022-06-22: qty 500

## 2022-06-22 MED ORDER — LIDOCAINE HCL (PF) 1 % IJ SOLN
1.0000 mL | Freq: Once | INTRAMUSCULAR | Status: AC
  Administered 2022-06-22: 1 mL
  Filled 2022-06-22: qty 5

## 2022-06-22 NOTE — ED Provider Notes (Addendum)
[facility] EMERGENCY DEPT Provider Note   CSN: [id number] Arrival date & time: 06/22/22  4401     History  Chief Complaint  Patient presents with   [facility] TRANSMITTED DISEASE    Robert Burton is a 36 y.o. male history of G6PD deficiency, sleep apnea, diabetes presents for concern of STI exposure.  He reports his partner told him that she tested positive for gonorrhea yesterday.  He reports that she was also tested for HIV and syphilis but those tests were negative.  Patient denies any symptoms.  He has no additional concerns.  He denies dysuria/hematuria, rash/lesion, testicular pain/swelling, abdominal pain, fever, chills or any additional concerns.  HPI     Home Medications Prior to Admission medications   Medication Sig Start Date End Date Taking? Authorizing Provider  doxycycline (VIBRA-TABS) 100 MG tablet Take 1 tablet (100 mg total) by mouth 2 (two) times daily for 7 days. 06/22/22 06/29/22 Yes Harlene Salts A, PA-C  acetaminophen (TYLENOL) 325 MG tablet Take 2 tablets (650 mg total) by mouth every 6 (six) hours as needed. 07/17/21   Wallis Bamberg, PA-C  albuterol [facility] HFA) [phone] Base) MCG/ACT inhaler Inhale 1-2 puffs into the lungs every 6 (six) hours as needed for wheezing or shortness of breath. 06/13/20   Georgetta Haber, NP  benzonatate (TESSALON) 100 MG capsule Take 1-2 capsules (100-200 mg total) by mouth 3 (three) times daily as needed for cough. 07/17/21   Wallis Bamberg, PA-C  cetirizine (ZYRTEC ALLERGY) 10 MG tablet Take 1 tablet (10 mg total) by mouth daily. 07/17/21   Wallis Bamberg, PA-C  gabapentin (NEURONTIN) 300 MG capsule Take 2 capsules (600 mg total) by mouth 2 (two) times daily for 7 days. 05/02/22 05/09/22  Clark, Meghan R, PA  ibuprofen (ADVIL) 600 MG tablet Take 1 tablet (600 mg total) by mouth every 6 (six) hours as needed. 10/28/21   Piontek, Denny Peon, MD  insulin aspart (NOVOLOG) 100 UNIT/ML injection Inject 15 units into the skin 3 (three)  times daily before meals.  Use only if blood sugar >300 05/02/22   Clark, Meghan R, PA  insulin glargine (LANTUS) 100 UNIT/ML injection Inject 0.3 mLs (30 Units total) into the skin at bedtime. 05/02/22   Clark, Meghan R, PA  lidocaine (LIDODERM) 5 % Place 1 patch onto the skin daily. Remove & Discard patch within 12 hours or as directed by MD 05/02/22   Melton Alar R, PA  metFORMIN (GLUCOPHAGE) 500 MG tablet Take 1 tablet (500 mg total) by mouth daily with breakfast for 7 days, THEN 1 tablet (500 mg total) 2 (two) times daily with a meal for 7 days. 10/15/20 10/29/20  Steffanie Rainwater, MD  methocarbamol (ROBAXIN) 500 MG tablet Take 1 tablet (500 mg total) by mouth 2 (two) times daily. 04/29/22   Gailen Shelter, PA  NON FORMULARY     [provider]  ondansetron (ZOFRAN ODT) 4 MG disintegrating tablet Take 1 tablet (4 mg total) by mouth every 8 (eight) hours as needed for nausea or vomiting. 02/03/21   Lamptey, Britta Mccreedy, MD  pantoprazole (PROTONIX) 20 MG tablet Take 1 tablet (20 mg total) by mouth daily. 02/03/21 03/05/21  Merrilee Jansky, MD  promethazine-dextromethorphan (PROMETHAZINE-DM) 6.25-15 MG/5ML syrup Take 5 mLs by mouth at bedtime as needed for cough. 07/17/21   Wallis Bamberg, PA-C      Allergies    Other    Review of Systems   Review of Systems  Constitutional: Negative.  Negative for chills and fever.  Gastrointestinal: Negative.  Negative for abdominal pain.  Genitourinary: Negative.  Negative for dysuria, penile discharge, scrotal swelling and testicular pain.    Physical Exam Updated Vital Signs BP (!) 154/92 (BP Location: Right Arm)   Pulse 97   Temp 98.1 F (36.7 C) (Oral)   Resp 16   Ht 6\' 1"  (1.854 m)   Wt 117.9 kg   SpO2 99%   BMI 34.29 kg/m  Physical Exam Constitutional:      General: He is not in acute distress.    Appearance: Normal appearance. He is well-developed. He is not ill-appearing or diaphoretic.  HENT:     Head: Normocephalic and  atraumatic.  Eyes:     General: Vision grossly intact. Gaze aligned appropriately.     Pupils: Pupils are equal, round, and reactive to light.  Neck:     Trachea: Trachea and phonation normal.  Pulmonary:     Effort: Pulmonary effort is normal. No respiratory distress.  Abdominal:     General: There is no distension.     Palpations: Abdomen is soft.     Tenderness: There is no abdominal tenderness. There is no guarding or rebound.  Genitourinary:    Comments: Chaperone present during genital exam, Brenda NT.  No external genital lesions noted, no bumps on head of penis, specifically no vesicles concerning for herpes or chancre suggestive of syphilis.  No pain with palpation of the penis/glans, no discharge or urethritis noted.  Scrotum and testicles without erythema/swelling or tenderness to palpation. Cremasteric reflex intact bilaterally. No palpable hernia noted.  Musculoskeletal:        General: Normal range of motion.     Cervical back: Normal range of motion.  Skin:    General: Skin is warm and dry.  Neurological:     Mental Status: He is alert.     GCS: GCS eye subscore is 4. GCS verbal subscore is 5. GCS motor subscore is 6.     Comments: Speech is clear and goal oriented, follows commands Major Cranial nerves without deficit, no facial droop Moves extremities without ataxia, coordination intact  Psychiatric:        Behavior: Behavior normal.     ED Results / Procedures / Treatments   Labs (all labs ordered are listed, but only abnormal results are displayed) Labs Reviewed  URINALYSIS, ROUTINE W REFLEX MICROSCOPIC - Abnormal; Notable for the following components:      Result Value   Glucose, UA >1,000 (*)    Ketones, ur 15 (*)    All other components within normal limits  GC/CHLAMYDIA PROBE AMP ([place]) NOT AT [facility]    EKG None  Radiology No results found.  Procedures Procedures    Medications Ordered in ED Medications  cefTRIAXone (ROCEPHIN)  injection 500 mg (500 mg Intramuscular Given 06/22/22 0833)  lidocaine (PF) (XYLOCAINE) 1 % injection 1-2.1 mL (1 mL Other Given 06/22/22 06/24/22)    ED Course/ Medical Decision Making/ A&P                           Medical Decision Making 36 year old male history includes G6PD deficiency, sleep apnea, diabetes presented for treatment of gonorrhea after male partner tested positive yesterday.  Patient is asymptomatic.  He has no additional concerns.  Physical examination chaperoned by 30 NT is overall unremarkable.  No rashes or lesions, no evidence for epididymitis, orchitis or other descending infections.  No abdominal  tenderness.  No systemic symptoms.  Vital signs stable on room air.  I offered to obtain swab for evaluation of gonorrhea/chlamydia to send off however patient declined he wishes to only provide urine sample today.  I offered testing for HIV and syphilis as well patient declined this 2.  We will treat empirically for gonorrhea and chlamydia with Rocephin and doxycycline.  I encouraged close follow-up with primary care provider or health department.    Pending GC chlamydia test patient advised to check them via his MyChart account in the next 2-3 days to discuss results with primary care provider. Patient advised to inform and treat all sexual partners.  Advised safe sex practices.  Amount and/or Complexity of Data Reviewed Labs: ordered.    Details: Urinalysis shows greater than 1000 glucose which appears to be baseline.  Also shows 15 ketones.  He has no symptoms to suggest DKA.  I encourage patient to maintain oral water hydration and continue taking his medications and monitor blood sugar levels closely.  I encouraged him to follow-up with his primary care provider for recheck within the week.  Patient was given ER precautions today.  No indication for further workup at this time. No evidence for UTI.  Risk Prescription drug management.    At this time there does not  appear to be any evidence of an acute emergency medical condition and the patient appears stable for discharge with appropriate outpatient follow up. Diagnosis was discussed with patient who verbalizes understanding of care plan and is agreeable to discharge. I have discussed return precautions with patient who verbalizes understanding. Patient encouraged to follow-up with their PCP. All questions answered.  Patient's case discussed with Dr. Denton Lank who agrees with antibiotics for STI and PCP follow-up on urinalysis.   Note: Portions of this report may have been transcribed using voice recognition software. Every effort was made to ensure accuracy; however, inadvertent computerized transcription errors may still be present.   Final Clinical Impression(s) / ED Diagnoses Final diagnoses:  Exposure to sexually transmitted disease (STD)    Rx / DC Orders ED Discharge Orders          Ordered    doxycycline (VIBRA-TABS) 100 MG tablet  2 times daily        06/22/22 0746               Bill Salinas, PA-C 06/22/22 3500    Cathren Laine, MD 06/22/22 1324

## 2022-06-22 NOTE — ED Notes (Signed)
Pt given discharge instructions and reviewed prescriptions. Opportunities given for questions. Pt verbalizes understanding. Deaysia Grigoryan R, RN 

## 2022-06-22 NOTE — ED Triage Notes (Signed)
Pt arrives to ED with c/o STD. He notes his partner tested positive for gonorrhea.

## 2022-06-22 NOTE — Discharge Instructions (Addendum)
At this time there does not appear to be the presence of an emergent medical condition, however there is always the potential for conditions to change. Please read and follow the below instructions.  Please return to the Emergency Department immediately for any new or worsening symptoms. Please be sure to follow up with your Primary Care Provider within one week regarding your visit today; please call their office to schedule an appointment even if you are feeling better for a follow-up visit. You have been treated presumptively today for gonorrhea and chlamydia be sure to take all of your antibiotic as prescribed until complete. You have been tested today for gonorrhea and chlamydia. These results will be available in approximately 3 days. You may check your MyChart account for results. Please inform all sexual partners of positive results and that they should be tested and treated as well. Please wait 2 weeks and be sure that you and your partners are symptom free before returning to sexual activity. Please use protection with every sexual encounter. Follow Up: Please followup with your primary doctor in 3 days for discussion of your diagnoses and further evaluation after today's visit; if you do not have a primary care doctor use the resource guide provided to find one; Please return to the ER for worsening symptoms, high fevers or persistent vomiting. Your urine today showed some ketones which may be due to dehydration.  Please be sure to drink plenty of water.  Please have your blood sugar levels and your urine rechecked by your primary care provider within a week.  If you develop any nausea, vomiting, increased urination or any other concerning symptoms please return to the ER.  Please read the additional information packets attached to your discharge summary.  Go to the nearest Emergency Department immediately if: You have fever or chills You have testicular pain or swelling You have burning when  you pee or blood in your urine You have abdominal pain nausea or vomiting You have any new/concerning or worsening of symptoms  Do not take your medicine if  develop an itchy rash, swelling in your mouth or lips, or difficulty breathing; call 911 and seek immediate emergency medical attention if this occurs.  You may review your lab tests and imaging results in their entirety on your MyChart account.  Please discuss all results of fully with your primary care provider and other specialist at your follow-up visit.  Note: Portions of this text may have been transcribed using voice recognition software. Every effort was made to ensure accuracy; however, inadvertent computerized transcription errors may still be present.

## 2022-06-23 LAB — GC/CHLAMYDIA PROBE AMP (~~LOC~~) NOT AT ARMC
Chlamydia: NEGATIVE
Comment: NEGATIVE
Comment: NORMAL
Neisseria Gonorrhea: NEGATIVE

## 2022-09-18 ENCOUNTER — Encounter: Payer: Self-pay | Admitting: Physician Assistant

## 2022-10-31 ENCOUNTER — Ambulatory Visit: Payer: Commercial Managed Care - HMO | Admitting: Physician Assistant

## 2024-01-26 ENCOUNTER — Emergency Department (HOSPITAL_BASED_OUTPATIENT_CLINIC_OR_DEPARTMENT_OTHER)
Admission: EM | Admit: 2024-01-26 | Discharge: 2024-01-26 | Disposition: A | Discharge status: Home / Self Care | Attending: Emergency Medicine | Admitting: Emergency Medicine

## 2024-01-26 ENCOUNTER — Emergency Department (HOSPITAL_BASED_OUTPATIENT_CLINIC_OR_DEPARTMENT_OTHER): Admitting: Radiology

## 2024-01-26 ENCOUNTER — Emergency Department (HOSPITAL_BASED_OUTPATIENT_CLINIC_OR_DEPARTMENT_OTHER)

## 2024-01-26 ENCOUNTER — Other Ambulatory Visit: Payer: Self-pay

## 2024-01-26 ENCOUNTER — Encounter (HOSPITAL_BASED_OUTPATIENT_CLINIC_OR_DEPARTMENT_OTHER): Payer: Self-pay

## 2024-01-26 DIAGNOSIS — S161XXA Strain of muscle, fascia and tendon at neck level, initial encounter: Secondary | ICD-10-CM | POA: Insufficient documentation

## 2024-01-26 DIAGNOSIS — S8002XA Contusion of left knee, initial encounter: Secondary | ICD-10-CM | POA: Diagnosis not present

## 2024-01-26 DIAGNOSIS — S199XXA Unspecified injury of neck, initial encounter: Secondary | ICD-10-CM | POA: Diagnosis present

## 2024-01-26 DIAGNOSIS — Y92410 Unspecified street and highway as the place of occurrence of the external cause: Secondary | ICD-10-CM | POA: Insufficient documentation

## 2024-01-26 DIAGNOSIS — Z794 Long term (current) use of insulin: Secondary | ICD-10-CM | POA: Insufficient documentation

## 2024-01-26 DIAGNOSIS — S39012A Strain of muscle, fascia and tendon of lower back, initial encounter: Secondary | ICD-10-CM | POA: Diagnosis not present

## 2024-01-26 MED ORDER — METHOCARBAMOL 750 MG PO TABS: 750.0000 mg | ORAL_TABLET | Freq: Three times a day (TID) | ORAL | 0 refills | Status: DC | PRN

## 2024-01-26 MED ORDER — IBUPROFEN 400 MG PO TABS: 400.0000 mg | ORAL_TABLET | Freq: Once | ORAL | Status: DC

## 2024-01-26 MED ORDER — ACETAMINOPHEN 500 MG PO TABS: 1000.0000 mg | ORAL_TABLET | Freq: Once | ORAL | Status: DC

## 2024-01-26 NOTE — Discharge Instructions (Signed)
It was our pleasure to provide your ER care today - we hope that you feel better.  Take acetaminophen or ibuprofen as need for pain. You may also take robaxin as need for muscle pain/spasm - no driving when taking.   Follow up with primary care doctor in 1-2 weeks if symptoms fail to improve/resolve.  Return to ER if worse, new symptoms, new/severe pain, trouble breathing, or other emergency concern.
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
## 2024-02-16 ENCOUNTER — Emergency Department (HOSPITAL_BASED_OUTPATIENT_CLINIC_OR_DEPARTMENT_OTHER)
Admission: EM | Admit: 2024-02-16 | Discharge: 2024-02-16 | Disposition: A | Discharge status: Home / Self Care | Attending: Emergency Medicine | Admitting: Emergency Medicine

## 2024-02-16 ENCOUNTER — Encounter (HOSPITAL_BASED_OUTPATIENT_CLINIC_OR_DEPARTMENT_OTHER): Payer: Self-pay

## 2024-02-16 DIAGNOSIS — M545 Low back pain, unspecified: Secondary | ICD-10-CM | POA: Diagnosis present

## 2024-02-16 DIAGNOSIS — M5441 Lumbago with sciatica, right side: Secondary | ICD-10-CM | POA: Insufficient documentation

## 2024-02-16 DIAGNOSIS — E109 Type 1 diabetes mellitus without complications: Secondary | ICD-10-CM | POA: Diagnosis not present

## 2024-02-16 DIAGNOSIS — M5431 Sciatica, right side: Secondary | ICD-10-CM

## 2024-02-16 LAB — CBG MONITORING, ED: Glucose-Capillary: 290 mg/dL — ABNORMAL HIGH (ref 70–99)

## 2024-02-16 MED ORDER — LIDOCAINE 5 % EX PTCH: 1.0000 | MEDICATED_PATCH | CUTANEOUS | 0 refills | Status: DC

## 2024-02-16 MED ORDER — DEXAMETHASONE SODIUM PHOSPHATE 10 MG/ML IJ SOLN
10.0000 mg | Freq: Once | INTRAMUSCULAR | Status: AC
  Administered 2024-02-16: 10 mg via INTRAMUSCULAR
  Filled 2024-02-16: qty 1

## 2024-02-16 MED ORDER — HYDROCODONE-ACETAMINOPHEN 5-325 MG PO TABS
2.0000 | ORAL_TABLET | Freq: Once | ORAL | Status: AC
  Administered 2024-02-16: 2 via ORAL
  Filled 2024-02-16: qty 2
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
## 2024-02-16 NOTE — Discharge Instructions (Signed)
 Use the Lidoderm  patches as needed.  You can also use ibuprofen  and the muscle relaxer as discussed.  Follow-up with your primary care doctor to make sure your symptoms are improving.  Return to the emergency room if you have any worsening symptoms.
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
## 2024-06-04 ENCOUNTER — Other Ambulatory Visit (HOSPITAL_COMMUNITY): Payer: Self-pay

## 2024-06-24 ENCOUNTER — Ambulatory Visit (HOSPITAL_COMMUNITY)
Admission: EM | Admit: 2024-06-24 | Discharge: 2024-06-24 | Disposition: A | Discharge status: Home / Self Care | Attending: Family Medicine | Admitting: Family Medicine

## 2024-06-24 ENCOUNTER — Ambulatory Visit (INDEPENDENT_AMBULATORY_CARE_PROVIDER_SITE_OTHER)

## 2024-06-24 ENCOUNTER — Encounter (HOSPITAL_COMMUNITY): Payer: Self-pay

## 2024-06-24 DIAGNOSIS — L97529 Non-pressure chronic ulcer of other part of left foot with unspecified severity: Secondary | ICD-10-CM

## 2024-06-24 DIAGNOSIS — E11628 Type 2 diabetes mellitus with other skin complications: Secondary | ICD-10-CM

## 2024-06-24 LAB — GLUCOSE, POCT (MANUAL RESULT ENTRY): POCT Glucose (KUC): 225 mg/dL — AB (ref 70–99)

## 2024-06-24 MED ORDER — LEVOFLOXACIN 500 MG PO TABS: 500.0000 mg | ORAL_TABLET | Freq: Every day | ORAL | 0 refills | Status: AC

## 2024-06-24 NOTE — Discharge Instructions (Addendum)
 You were seen today for an ulcer to the foot.  Your xray does not show any bony abnormality.  I have sent out an oral antibiotic to treat this today.  I have also placed a referral to the [facility] if this does not heal for you.  You may call them at [phone] for an appointment as well.  Please follow up with your primary care provider in terms of your diabetic neuropathic pain as well.

## 2024-06-24 NOTE — ED Triage Notes (Signed)
 Patient states he has infection on his left foot great toe. Patient states he is diabetic but does not have access to PCP. Will schedule PCP today.

## 2024-06-24 NOTE — ED Provider Notes (Signed)
 [facility]    CSN: [id number] Arrival date & time: 06/24/24  1211      History   Chief Complaint No chief complaint on file.   HPI Robert Burton is a 38 y.o. male.   Patient is here for a sore to the top of the left great toe x several weeks.  He has been dealing with this on his own.  This morning he was in the shower and the top layer of skin came off and this scared him. He has DM, on insulin .  +diabetic neuropathy.  He does have pain to the toe and up the foot.  No fevers/chills.        Past Medical History:  Diagnosis Date   Diabetes mellitus    G6PD deficiency    Sleep apnea     Patient Active Problem List   Diagnosis Date Noted   Diabetes mellitus (HCC) 10/15/2020   Allergic rhinitis 10/15/2020   Depression 10/15/2020   G6PD deficiency 10/15/2020   GERD (gastroesophageal reflux disease) 10/15/2020   Low back pain 10/15/2020    Past Surgical History:  Procedure Laterality Date   TONSILLECTOMY         Home Medications    Prior to Admission medications  Medication Sig Start Date End Date Taking? Authorizing Provider  acetaminophen  (TYLENOL ) 325 MG tablet Take 2 tablets (650 mg total) by mouth every 6 (six) hours as needed. 07/17/21  Yes Christopher Savannah, PA-C  albuterol  (PROAIR  HFA) 108 (90 Base) MCG/ACT inhaler Inhale 1-2 puffs into the lungs every 6 (six) hours as needed for wheezing or shortness of breath. 06/13/20  Yes Burky, Natalie B, NP  ibuprofen  (ADVIL ) 600 MG tablet Take 1 tablet (600 mg total) by mouth every 6 (six) hours as needed. 10/28/21  Yes Jamine Highfill, Rocky, MD  insulin  aspart (NOVOLOG ) 100 UNIT/ML injection Inject 15 units into the skin 3 (three) times daily before meals.  Use only if blood sugar >300 05/02/22  Yes Clark, Meghan R, PA-C  insulin  glargine (LANTUS ) 100 UNIT/ML injection Inject 0.3 mLs (30 Units total) into the skin at bedtime. 05/02/22  Yes Clark, Meghan R, PA-C  benzonatate  (TESSALON ) 100 MG capsule Take 1-2 capsules  (100-200 mg total) by mouth 3 (three) times daily as needed for cough. 07/17/21   Christopher Savannah, PA-C  cetirizine  (ZYRTEC  ALLERGY) 10 MG tablet Take 1 tablet (10 mg total) by mouth daily. 07/17/21   Christopher Savannah, PA-C  gabapentin  (NEURONTIN ) 300 MG capsule Take 2 capsules (600 mg total) by mouth 2 (two) times daily for 7 days. 05/02/22 05/09/22  Clark, Meghan R, PA-C  lidocaine  (LIDODERM ) 5 % Place 1 patch onto the skin daily. Remove & Discard patch within 12 hours or as directed by MD 02/16/24   Lenor Hollering, MD  metFORMIN  (GLUCOPHAGE ) 500 MG tablet Take 1 tablet (500 mg total) by mouth daily with breakfast for 7 days, THEN 1 tablet (500 mg total) 2 (two) times daily with a meal for 7 days. 10/15/20 10/29/20  Amponsah, Prosper M, MD  methocarbamol  (ROBAXIN ) 750 MG tablet Take 1 tablet (750 mg total) by mouth 3 (three) times daily as needed (muscle spasm/pain). 01/26/24   Bernard Drivers, MD  NON FORMULARY     [provider]  ondansetron  (ZOFRAN  ODT) 4 MG disintegrating tablet Take 1 tablet (4 mg total) by mouth every 8 (eight) hours as needed for nausea or vomiting. 02/03/21   Lamptey, Aleene KIDD, MD  pantoprazole  (PROTONIX ) 20 MG tablet Take 1  tablet (20 mg total) by mouth daily. 02/03/21 03/05/21  Blaise Aleene KIDD, MD  promethazine -dextromethorphan (PROMETHAZINE -DM) 6.25-15 MG/5ML syrup Take 5 mLs by mouth at bedtime as needed for cough. 07/17/21   Christopher Savannah, PA-C    Family History Family History  Problem Relation Age of Onset   Diabetes Mother    Asthma Mother     Social History Social History[1]   Allergies   Penicillins, Sulfa antibiotics, and Other   Review of Systems Review of Systems  Constitutional: Negative.   HENT: Negative.    Respiratory: Negative.    Cardiovascular: Negative.   Gastrointestinal: Negative.   Skin:  Positive for wound.     Physical Exam Triage Vital Signs ED Triage Vitals [06/24/24 1314]  Encounter Vitals Group     BP 114/76     Girls Systolic BP  Percentile      Girls Diastolic BP Percentile      Boys Systolic BP Percentile      Boys Diastolic BP Percentile      Pulse Rate (!) 120     Resp 18     Temp 97.9 F (36.6 C)     Temp Source Oral     SpO2 97 %     Weight      Height      Head Circumference      Peak Flow      Pain Score      Pain Loc      Pain Education      Exclude from Growth Chart    No data found.  Updated Vital Signs BP 114/76 (BP Location: Left Arm)   Pulse (!) 108   Temp 97.9 F (36.6 C) (Oral)   Resp 18   SpO2 97%   Visual Acuity Right Eye Distance:   Left Eye Distance:   Bilateral Distance:    Right Eye Near:   Left Eye Near:    Bilateral Near:     Physical Exam Constitutional:      Appearance: Normal appearance. He is normal weight.  Skin:    Comments: At the top of the left great toe, at the DIP joint is break down of skin;  this appears superficial;  no redness/warmth/drainage is noted at this time  Neurological:     Mental Status: He is alert.      UC Treatments / Results  Labs (all labs ordered are listed, but only abnormal results are displayed) Labs Reviewed  GLUCOSE, POCT (MANUAL RESULT ENTRY) - Abnormal; Notable for the following components:      Result Value   POCT Glucose ([facility]) 225 (*)    All other components within normal limits    EKG   Radiology DG Toe Great Left Result Date: 06/24/2024 CLINICAL DATA:  Left great toe infection. EXAM: LEFT GREAT TOE COMPARISON:  None Available. FINDINGS: There is no evidence of fracture or dislocation. There is no evidence of arthropathy or other focal bone abnormality. Diffuse soft tissue swelling is noted concerning for infection. No definite lytic destruction is noted. IMPRESSION: Diffuse soft tissue swelling is noted concerning for infection. No definite lytic destruction is noted. Electronically Signed   By: Lynwood Landy Raddle M.D.   On: 06/24/2024 14:19    Procedures Procedures (including critical care time)  Medications  Ordered in UC Medications - No data to display  Initial Impression / Assessment and Plan / UC Course  I have reviewed the triage vital signs and the nursing notes.  Pertinent  labs & imaging results that were available during my care of the patient were reviewed by me and considered in my medical decision making (see chart for details).   Final Clinical Impressions(s) / UC Diagnoses   Final diagnoses:  Type 2 diabetes mellitus with other skin complication, without long-term current use of insulin  (HCC)  Ulcer of toe of left foot, unspecified ulcer stage [facility])     Discharge Instructions      You were seen today for an ulcer to the foot.  Your xray does not show any bony abnormality.  I have sent out an oral antibiotic to treat this today.  I have also placed a referral to [facility] if this does not heal for you.  You may call them at [phone] for an appointment as well.  Please follow up with your primary care provider in terms of your diabetic neuropathic pain as well.     ED Prescriptions     Medication Sig Dispense Auth. Provider   levofloxacin  (LEVAQUIN ) 500 MG tablet Take 1 tablet (500 mg total) by mouth daily. 7 tablet Darral Longs, MD      PDMP not reviewed this encounter.     [1]  Social History Tobacco Use   Smoking status: Every Day    Current packs/day: 0.10    Types: Cigarettes   Smokeless tobacco: Never   Tobacco comments:    2 cigs per day  Vaping Use   Vaping status: Never Used  Substance Use Topics   Alcohol use: Yes    Comment: 2x/week   Drug use: Yes    Types: Marijuana     Darral Longs, MD 06/24/24 1436

## 2024-08-04 ENCOUNTER — Ambulatory Visit: Payer: Self-pay
